# Patient Record
Sex: Male | Born: 1950
Health system: Southern US, Community
[De-identification: ages and names within clinical notes are randomized; demographics above are authoritative.]

## PROBLEM LIST (undated history)

## (undated) DIAGNOSIS — E785 Hyperlipidemia, unspecified: Secondary | ICD-10-CM

## (undated) DIAGNOSIS — T7840XA Allergy, unspecified, initial encounter: Secondary | ICD-10-CM

## (undated) DIAGNOSIS — I4892 Unspecified atrial flutter: Secondary | ICD-10-CM

## (undated) DIAGNOSIS — I480 Paroxysmal atrial fibrillation: Secondary | ICD-10-CM

## (undated) HISTORY — DX: Unspecified atrial flutter: I48.92

## (undated) HISTORY — PX: SKULL FRACTURE ELEVATION: SHX781

## (undated) HISTORY — PX: MOUTH SURGERY: SHX715

## (undated) HISTORY — DX: Hyperlipidemia, unspecified: E78.5

## (undated) HISTORY — DX: Paroxysmal atrial fibrillation: I48.0

## (undated) HISTORY — DX: Allergy, unspecified, initial encounter: T78.40XA

## (undated) HISTORY — PX: HAND SURGERY: SHX662

## (undated) HISTORY — PX: NASAL SINUS SURGERY: SHX719

---

## 2008-03-08 ENCOUNTER — Ambulatory Visit: Payer: Self-pay | Admitting: Gastroenterology

## 2008-03-13 ENCOUNTER — Telehealth: Payer: Self-pay | Admitting: Gastroenterology

## 2008-03-18 ENCOUNTER — Encounter: Payer: Self-pay | Admitting: Gastroenterology

## 2008-03-18 ENCOUNTER — Ambulatory Visit: Payer: Self-pay | Admitting: Gastroenterology

## 2008-03-19 ENCOUNTER — Encounter: Payer: Self-pay | Admitting: Gastroenterology

## 2016-02-11 DIAGNOSIS — L814 Other melanin hyperpigmentation: Secondary | ICD-10-CM | POA: Diagnosis not present

## 2016-02-11 DIAGNOSIS — D2272 Melanocytic nevi of left lower limb, including hip: Secondary | ICD-10-CM | POA: Diagnosis not present

## 2016-02-11 DIAGNOSIS — D485 Neoplasm of uncertain behavior of skin: Secondary | ICD-10-CM | POA: Diagnosis not present

## 2016-02-11 DIAGNOSIS — D2271 Melanocytic nevi of right lower limb, including hip: Secondary | ICD-10-CM | POA: Diagnosis not present

## 2016-02-11 DIAGNOSIS — L821 Other seborrheic keratosis: Secondary | ICD-10-CM | POA: Diagnosis not present

## 2016-02-11 DIAGNOSIS — L570 Actinic keratosis: Secondary | ICD-10-CM | POA: Diagnosis not present

## 2016-02-11 DIAGNOSIS — D225 Melanocytic nevi of trunk: Secondary | ICD-10-CM | POA: Diagnosis not present

## 2016-02-17 DIAGNOSIS — L570 Actinic keratosis: Secondary | ICD-10-CM | POA: Diagnosis not present

## 2016-04-19 DIAGNOSIS — E784 Other hyperlipidemia: Secondary | ICD-10-CM | POA: Diagnosis not present

## 2016-04-19 DIAGNOSIS — Z0000 Encounter for general adult medical examination without abnormal findings: Secondary | ICD-10-CM | POA: Diagnosis not present

## 2016-04-19 DIAGNOSIS — Z125 Encounter for screening for malignant neoplasm of prostate: Secondary | ICD-10-CM | POA: Diagnosis not present

## 2016-08-10 DIAGNOSIS — H6983 Other specified disorders of Eustachian tube, bilateral: Secondary | ICD-10-CM | POA: Diagnosis not present

## 2016-08-10 DIAGNOSIS — H6502 Acute serous otitis media, left ear: Secondary | ICD-10-CM | POA: Diagnosis not present

## 2016-08-10 DIAGNOSIS — J324 Chronic pansinusitis: Secondary | ICD-10-CM | POA: Diagnosis not present

## 2017-02-07 DIAGNOSIS — Z125 Encounter for screening for malignant neoplasm of prostate: Secondary | ICD-10-CM | POA: Diagnosis not present

## 2017-02-07 DIAGNOSIS — R82998 Other abnormal findings in urine: Secondary | ICD-10-CM | POA: Diagnosis not present

## 2017-02-07 DIAGNOSIS — E7849 Other hyperlipidemia: Secondary | ICD-10-CM | POA: Diagnosis not present

## 2017-02-14 DIAGNOSIS — Z1389 Encounter for screening for other disorder: Secondary | ICD-10-CM | POA: Diagnosis not present

## 2017-02-14 DIAGNOSIS — Z6829 Body mass index (BMI) 29.0-29.9, adult: Secondary | ICD-10-CM | POA: Diagnosis not present

## 2017-02-14 DIAGNOSIS — J302 Other seasonal allergic rhinitis: Secondary | ICD-10-CM | POA: Diagnosis not present

## 2017-02-14 DIAGNOSIS — R351 Nocturia: Secondary | ICD-10-CM | POA: Diagnosis not present

## 2017-02-14 DIAGNOSIS — Z23 Encounter for immunization: Secondary | ICD-10-CM | POA: Diagnosis not present

## 2017-02-14 DIAGNOSIS — E7849 Other hyperlipidemia: Secondary | ICD-10-CM | POA: Diagnosis not present

## 2017-02-14 DIAGNOSIS — Z0000 Encounter for general adult medical examination without abnormal findings: Secondary | ICD-10-CM | POA: Diagnosis not present

## 2017-02-14 DIAGNOSIS — F5221 Male erectile disorder: Secondary | ICD-10-CM | POA: Diagnosis not present

## 2017-02-16 ENCOUNTER — Other Ambulatory Visit: Payer: Self-pay | Admitting: Internal Medicine

## 2017-02-16 DIAGNOSIS — E785 Hyperlipidemia, unspecified: Secondary | ICD-10-CM

## 2017-02-18 DIAGNOSIS — Z1212 Encounter for screening for malignant neoplasm of rectum: Secondary | ICD-10-CM | POA: Diagnosis not present

## 2017-03-14 ENCOUNTER — Other Ambulatory Visit: Payer: Self-pay

## 2017-03-21 ENCOUNTER — Ambulatory Visit
Admission: RE | Admit: 2017-03-21 | Discharge: 2017-03-21 | Disposition: A | Discharge status: Home / Self Care | Payer: No Typology Code available for payment source | Source: Ambulatory Visit | Attending: Internal Medicine | Admitting: Internal Medicine

## 2017-03-21 DIAGNOSIS — E785 Hyperlipidemia, unspecified: Secondary | ICD-10-CM

## 2017-07-06 DIAGNOSIS — E7849 Other hyperlipidemia: Secondary | ICD-10-CM | POA: Diagnosis not present

## 2017-07-06 DIAGNOSIS — E785 Hyperlipidemia, unspecified: Secondary | ICD-10-CM | POA: Diagnosis not present

## 2017-07-14 DIAGNOSIS — M79661 Pain in right lower leg: Secondary | ICD-10-CM | POA: Diagnosis not present

## 2017-07-28 DIAGNOSIS — M79661 Pain in right lower leg: Secondary | ICD-10-CM | POA: Diagnosis not present

## 2017-11-22 DIAGNOSIS — Z125 Encounter for screening for malignant neoplasm of prostate: Secondary | ICD-10-CM | POA: Diagnosis not present

## 2017-11-22 DIAGNOSIS — N400 Enlarged prostate without lower urinary tract symptoms: Secondary | ICD-10-CM | POA: Diagnosis not present

## 2017-11-22 DIAGNOSIS — R361 Hematospermia: Secondary | ICD-10-CM | POA: Diagnosis not present

## 2018-02-10 DIAGNOSIS — E7849 Other hyperlipidemia: Secondary | ICD-10-CM | POA: Diagnosis not present

## 2018-02-10 DIAGNOSIS — R82998 Other abnormal findings in urine: Secondary | ICD-10-CM | POA: Diagnosis not present

## 2018-02-10 DIAGNOSIS — Z125 Encounter for screening for malignant neoplasm of prostate: Secondary | ICD-10-CM | POA: Diagnosis not present

## 2018-02-16 DIAGNOSIS — M62831 Muscle spasm of calf: Secondary | ICD-10-CM | POA: Diagnosis not present

## 2018-02-17 DIAGNOSIS — Z0000 Encounter for general adult medical examination without abnormal findings: Secondary | ICD-10-CM | POA: Diagnosis not present

## 2018-02-17 DIAGNOSIS — F5221 Male erectile disorder: Secondary | ICD-10-CM | POA: Diagnosis not present

## 2018-02-17 DIAGNOSIS — R361 Hematospermia: Secondary | ICD-10-CM | POA: Diagnosis not present

## 2018-02-17 DIAGNOSIS — M79669 Pain in unspecified lower leg: Secondary | ICD-10-CM | POA: Diagnosis not present

## 2018-02-17 DIAGNOSIS — Z1339 Encounter for screening examination for other mental health and behavioral disorders: Secondary | ICD-10-CM | POA: Diagnosis not present

## 2018-02-17 DIAGNOSIS — J302 Other seasonal allergic rhinitis: Secondary | ICD-10-CM | POA: Diagnosis not present

## 2018-02-17 DIAGNOSIS — R351 Nocturia: Secondary | ICD-10-CM | POA: Diagnosis not present

## 2018-02-17 DIAGNOSIS — Z1330 Encounter for screening examination for mental health and behavioral disorders, unspecified: Secondary | ICD-10-CM | POA: Diagnosis not present

## 2018-02-17 DIAGNOSIS — R7989 Other specified abnormal findings of blood chemistry: Secondary | ICD-10-CM | POA: Diagnosis not present

## 2018-02-17 DIAGNOSIS — E668 Other obesity: Secondary | ICD-10-CM | POA: Diagnosis not present

## 2018-02-17 DIAGNOSIS — E7849 Other hyperlipidemia: Secondary | ICD-10-CM | POA: Diagnosis not present

## 2018-02-17 DIAGNOSIS — Z1331 Encounter for screening for depression: Secondary | ICD-10-CM | POA: Diagnosis not present

## 2018-02-20 DIAGNOSIS — Z1212 Encounter for screening for malignant neoplasm of rectum: Secondary | ICD-10-CM | POA: Diagnosis not present

## 2018-03-16 ENCOUNTER — Encounter: Payer: Self-pay | Admitting: Gastroenterology

## 2018-04-03 ENCOUNTER — Telehealth: Payer: Self-pay | Admitting: *Deleted

## 2018-04-03 NOTE — Telephone Encounter (Signed)
Covid-19 travel screening questions  Have you traveled in the last 14 days? No If yes where?   Do you now or have you had a fever in the last 14 days? No  Do you have any respiratory symptoms of shortness of breath or cough now or in the last 14 days? No  Do you have a medical history of Congestive Heart Failure? No  Do you have a medical history of lung disease? No  Do you have any family members or close contacts with diagnosed or suspected Covid-19? No        

## 2018-04-03 NOTE — Telephone Encounter (Signed)
Covid-19 travel screening questions  Have you traveled in the last 14 days? If yes where?  Do you now or have you had a fever in the last 14 days?  Do you have any respiratory symptoms of shortness of breath or cough now or in the last 14 days?  Do you have a medical history of Congestive Heart Failure?  Do you have a medical history of lung disease?  Do you have any family members or close contacts with diagnosed or suspected Covid-19?       

## 2018-04-04 ENCOUNTER — Encounter: Payer: Self-pay | Admitting: Gastroenterology

## 2018-04-04 ENCOUNTER — Other Ambulatory Visit: Payer: Self-pay

## 2018-04-04 ENCOUNTER — Ambulatory Visit (AMBULATORY_SURGERY_CENTER): Payer: Self-pay

## 2018-04-04 VITALS — Ht 70.0 in | Wt 187.4 lb

## 2018-04-04 DIAGNOSIS — Z1211 Encounter for screening for malignant neoplasm of colon: Secondary | ICD-10-CM

## 2018-04-04 MED ORDER — PEG 3350-KCL-NA BICARB-NACL 420 G PO SOLR: 4000.0000 mL | Freq: Once | ORAL | 0 refills | Status: AC

## 2018-04-04 NOTE — Progress Notes (Signed)
Denies allergies to eggs or soy products. Denies complication of anesthesia or sedation. Denies use of weight loss medication. Denies use of O2.   Emmi instructions declined.   Patients temperature was 98.6. Patient states that he has not traveled internationally and he has not experienced any signs or symptoms of illness.   Patient was given a choice of bowel preps due to what his insurance will pay and he chose [facility].

## 2018-04-18 ENCOUNTER — Encounter: Payer: Self-pay | Admitting: Gastroenterology

## 2018-08-14 DIAGNOSIS — D224 Melanocytic nevi of scalp and neck: Secondary | ICD-10-CM | POA: Diagnosis not present

## 2018-08-14 DIAGNOSIS — D225 Melanocytic nevi of trunk: Secondary | ICD-10-CM | POA: Diagnosis not present

## 2018-08-14 DIAGNOSIS — D2262 Melanocytic nevi of left upper limb, including shoulder: Secondary | ICD-10-CM | POA: Diagnosis not present

## 2018-08-14 DIAGNOSIS — L814 Other melanin hyperpigmentation: Secondary | ICD-10-CM | POA: Diagnosis not present

## 2018-08-14 DIAGNOSIS — L570 Actinic keratosis: Secondary | ICD-10-CM | POA: Diagnosis not present

## 2018-08-14 DIAGNOSIS — D2261 Melanocytic nevi of right upper limb, including shoulder: Secondary | ICD-10-CM | POA: Diagnosis not present

## 2018-08-14 DIAGNOSIS — D1801 Hemangioma of skin and subcutaneous tissue: Secondary | ICD-10-CM | POA: Diagnosis not present

## 2018-08-14 DIAGNOSIS — L821 Other seborrheic keratosis: Secondary | ICD-10-CM | POA: Diagnosis not present

## 2019-02-09 ENCOUNTER — Ambulatory Visit: Payer: PPO | Attending: Internal Medicine

## 2019-02-09 DIAGNOSIS — Z23 Encounter for immunization: Secondary | ICD-10-CM | POA: Insufficient documentation

## 2019-02-09 NOTE — Progress Notes (Signed)
   Covid-19 Vaccination Clinic  Name:  Michael Hopkins    MRN: [id number] DOB: 06/03/1950  02/09/2019  [name] was observed post Covid-19 immunization for 15 minutes without incidence. He was provided with Vaccine Information Sheet and instruction to access the V-Safe system.   [name] was instructed to call 911 with any severe reactions post vaccine: Marland Kitchen Difficulty breathing  . Swelling of your face and throat  . A fast heartbeat  . A bad rash all over your body  . Dizziness and weakness    Immunizations Administered    Name Date Dose VIS Date Route   Pfizer COVID-19 Vaccine 02/09/2019  3:39 PM 0.3 mL 12/29/2018 Intramuscular   Manufacturer: [place]   Lot: [id number]   [place]: [id number]

## 2019-02-16 DIAGNOSIS — Z125 Encounter for screening for malignant neoplasm of prostate: Secondary | ICD-10-CM | POA: Diagnosis not present

## 2019-02-16 DIAGNOSIS — E7849 Other hyperlipidemia: Secondary | ICD-10-CM | POA: Diagnosis not present

## 2019-02-19 DIAGNOSIS — R82998 Other abnormal findings in urine: Secondary | ICD-10-CM | POA: Diagnosis not present

## 2019-02-23 DIAGNOSIS — E785 Hyperlipidemia, unspecified: Secondary | ICD-10-CM | POA: Diagnosis not present

## 2019-02-23 DIAGNOSIS — F5221 Male erectile disorder: Secondary | ICD-10-CM | POA: Diagnosis not present

## 2019-02-23 DIAGNOSIS — R351 Nocturia: Secondary | ICD-10-CM | POA: Diagnosis not present

## 2019-02-23 DIAGNOSIS — E669 Obesity, unspecified: Secondary | ICD-10-CM | POA: Diagnosis not present

## 2019-02-23 DIAGNOSIS — F17210 Nicotine dependence, cigarettes, uncomplicated: Secondary | ICD-10-CM | POA: Diagnosis not present

## 2019-02-23 DIAGNOSIS — R361 Hematospermia: Secondary | ICD-10-CM | POA: Diagnosis not present

## 2019-02-23 DIAGNOSIS — J302 Other seasonal allergic rhinitis: Secondary | ICD-10-CM | POA: Diagnosis not present

## 2019-02-23 DIAGNOSIS — Z1331 Encounter for screening for depression: Secondary | ICD-10-CM | POA: Diagnosis not present

## 2019-02-23 DIAGNOSIS — Z0000 Encounter for general adult medical examination without abnormal findings: Secondary | ICD-10-CM | POA: Diagnosis not present

## 2019-02-23 DIAGNOSIS — I700 Atherosclerosis of aorta: Secondary | ICD-10-CM | POA: Diagnosis not present

## 2019-02-23 DIAGNOSIS — M79669 Pain in unspecified lower leg: Secondary | ICD-10-CM | POA: Diagnosis not present

## 2019-02-23 DIAGNOSIS — R7989 Other specified abnormal findings of blood chemistry: Secondary | ICD-10-CM | POA: Diagnosis not present

## 2019-02-23 DIAGNOSIS — Z1339 Encounter for screening examination for other mental health and behavioral disorders: Secondary | ICD-10-CM | POA: Diagnosis not present

## 2019-02-23 DIAGNOSIS — I2584 Coronary atherosclerosis due to calcified coronary lesion: Secondary | ICD-10-CM | POA: Diagnosis not present

## 2019-02-26 DIAGNOSIS — Z1212 Encounter for screening for malignant neoplasm of rectum: Secondary | ICD-10-CM | POA: Diagnosis not present

## 2019-03-02 ENCOUNTER — Ambulatory Visit: Payer: PPO | Attending: Internal Medicine

## 2019-03-02 DIAGNOSIS — Z23 Encounter for immunization: Secondary | ICD-10-CM | POA: Insufficient documentation

## 2019-03-02 NOTE — Progress Notes (Signed)
   Covid-19 Vaccination Clinic  Name:  [name]    MRN: [id number] DOB: 1950-11-20  03/02/2019  Mr. Schoof was observed post Covid-19 immunization for 15 minutes without incidence. He was provided with Vaccine Information Sheet and instruction to access the V-Safe system.   Mr. Pound was instructed to call 911 with any severe reactions post vaccine: Marland Kitchen Difficulty breathing  . Swelling of your face and throat  . A fast heartbeat  . A bad rash all over your body  . Dizziness and weakness    Immunizations Administered    Name Date Dose VIS Date Route   Pfizer COVID-19 Vaccine 03/02/2019 11:42 AM 0.3 mL 12/29/2018 Intramuscular   Manufacturer: [place]   Lot: [id number]   [place]: [id number]

## 2019-08-07 DIAGNOSIS — L570 Actinic keratosis: Secondary | ICD-10-CM | POA: Diagnosis not present

## 2019-08-07 DIAGNOSIS — L814 Other melanin hyperpigmentation: Secondary | ICD-10-CM | POA: Diagnosis not present

## 2019-08-07 DIAGNOSIS — L821 Other seborrheic keratosis: Secondary | ICD-10-CM | POA: Diagnosis not present

## 2019-08-07 DIAGNOSIS — D225 Melanocytic nevi of trunk: Secondary | ICD-10-CM | POA: Diagnosis not present

## 2019-09-07 DIAGNOSIS — I2584 Coronary atherosclerosis due to calcified coronary lesion: Secondary | ICD-10-CM | POA: Diagnosis not present

## 2019-09-07 DIAGNOSIS — R002 Palpitations: Secondary | ICD-10-CM | POA: Diagnosis not present

## 2019-09-07 DIAGNOSIS — I700 Atherosclerosis of aorta: Secondary | ICD-10-CM | POA: Diagnosis not present

## 2019-09-07 DIAGNOSIS — E785 Hyperlipidemia, unspecified: Secondary | ICD-10-CM | POA: Diagnosis not present

## 2019-10-19 ENCOUNTER — Telehealth: Payer: Self-pay | Admitting: Cardiology

## 2019-10-19 ENCOUNTER — Other Ambulatory Visit: Payer: Self-pay | Admitting: *Deleted

## 2019-10-19 ENCOUNTER — Telehealth: Payer: Self-pay | Admitting: *Deleted

## 2019-10-19 DIAGNOSIS — R002 Palpitations: Secondary | ICD-10-CM

## 2019-10-19 NOTE — Telephone Encounter (Signed)
Confirmed with patient referral was not to see a cardiologist.  Information entered into Epic for referral was Event monitor for palpitation.14 day ZIO was not mentioned or specified what type of ZIO monitor.   Patient states he would like to proceed with [employer] cardiac event monitor and possibly wear it for just 14 days, but has the option of wearing it 30 days.  [employer] has already called to confirm a shipping address with patient and he has received approximately one half hour tutoring on monitor hookup and functions.

## 2019-10-19 NOTE — Telephone Encounter (Signed)
[facility] called and stated that the referral was strickly for zio monitor.

## 2019-10-19 NOTE — Telephone Encounter (Signed)
Patient enrolled for [employer] to ship a 30 day cardiac event monitor to his home.  Instructions reviewed with patient.

## 2019-10-22 ENCOUNTER — Encounter: Payer: Self-pay | Admitting: *Deleted

## 2019-10-22 NOTE — Progress Notes (Signed)
Patient ID: Michael Hopkins, male   DOB: Apr 02, 1950, 69 y.o.   MRN: [id number] Written order form  from [place] verified to read Event Monitor per Ronald Lobo. ZIO monitor not mentioned on order referral.

## 2019-10-29 ENCOUNTER — Ambulatory Visit (INDEPENDENT_AMBULATORY_CARE_PROVIDER_SITE_OTHER): Payer: PPO

## 2019-10-29 ENCOUNTER — Encounter: Payer: Self-pay | Admitting: Internal Medicine

## 2019-10-29 DIAGNOSIS — R002 Palpitations: Secondary | ICD-10-CM

## 2019-10-30 ENCOUNTER — Telehealth: Payer: Self-pay | Admitting: Cardiology

## 2019-10-30 ENCOUNTER — Encounter: Payer: Self-pay | Admitting: Cardiology

## 2019-10-30 ENCOUNTER — Other Ambulatory Visit: Payer: Self-pay

## 2019-10-30 ENCOUNTER — Ambulatory Visit: Payer: PPO | Admitting: Cardiology

## 2019-10-30 VITALS — BP 142/90 | HR 78 | Ht 70.0 in | Wt 191.8 lb

## 2019-10-30 DIAGNOSIS — I4891 Unspecified atrial fibrillation: Secondary | ICD-10-CM | POA: Diagnosis not present

## 2019-10-30 DIAGNOSIS — E7800 Pure hypercholesterolemia, unspecified: Secondary | ICD-10-CM | POA: Diagnosis not present

## 2019-10-30 DIAGNOSIS — R931 Abnormal findings on diagnostic imaging of heart and coronary circulation: Secondary | ICD-10-CM

## 2019-10-30 MED ORDER — METOPROLOL SUCCINATE ER 25 MG PO TB24: 25.0000 mg | ORAL_TABLET | Freq: Every day | ORAL | 1 refills | Status: DC

## 2019-10-30 MED ORDER — ATORVASTATIN CALCIUM 20 MG PO TABS: 20.0000 mg | ORAL_TABLET | Freq: Every day | ORAL | 1 refills | Status: DC

## 2019-10-30 MED ORDER — APIXABAN 5 MG PO TABS: 5.0000 mg | ORAL_TABLET | Freq: Two times a day (BID) | ORAL | 1 refills | Status: DC

## 2019-10-30 NOTE — Telephone Encounter (Signed)
Received a call from [employer] that pt had his first tracing of AF.  Pt does not have a history of AF.  Rate was 130, non sustained occurring this morning at 8:52AM CST.  Spoke with pt and he denies any symptoms, stating he was just waking up.  Has been having palps for a few months now and was concerned it might be AF due to family history (mom and brother have it).  Spoke with DOD, Dr. Johney Frame and she said ok to bring pt in today to discuss antiocoags.  Called pt and left message to call back.

## 2019-10-30 NOTE — Telephone Encounter (Signed)
Spoke with pt and he is agreeable to come in today for an appt.  Provided address.  Pt appreciative for call.

## 2019-10-30 NOTE — Telephone Encounter (Signed)
Patient returning phone call, transferred to [facility].

## 2019-10-30 NOTE — Progress Notes (Signed)
Cardiology Office Note:    Date:  10/30/2019   ID:  Michael Hopkins, DOB 1950-07-19, MRN [id number]  PCP:  Michael Baton, MD  [facility] Cardiologist:  No primary care provider on file.  [facility] Electrophysiologist:  None   Referring MD: Michael Baton, MD     History of Present Illness:    Michael Hopkins is a 69 y.o. male with a hx of HLD and palpitations who was noted to have new  Afib on ZIO monitor that was placed for palpitations now referred by Dr. Virgina Hopkins for further management of atrial fibrillation.  The patient states that he has been having palpitations, feelings of his "heart pounding" and rapid heart rates for several months. Saw his PCP who placed him on a zio patch and was found to have episode of Afib this AM. Asymptomatic at the time. Lasted a couple of seconds before returning to NSR. First day of the monitor was today with planned 2 weeks. Felt like he was in Afib yesterday while golfing as was having palpitations and HR was in 120s. No lightheadedness, dizziness, chest pain or SOB. The palpitations are "annoying" but do not limit his activities. He is physically active and plays tennis, pickle ball, and golf regularly.  Of note, coronary calcium score 3 years ago was 400. No CHF, DMII, HTN.     Mother: history of Afib and TIA; brother with Afib.  Past Medical History:  Diagnosis Date  . Allergy   . Hyperlipidemia     Past Surgical History:  Procedure Laterality Date  . HAND SURGERY Right    Broken bone in right hand.  Marland Kitchen MOUTH SURGERY     Age 13  . NASAL SINUS SURGERY    . SKULL FRACTURE ELEVATION     Skull Fracture at age 33     Current Medications: Current Meds  Medication Sig  . atorvastatin (LIPITOR) 20 MG tablet Take 1 tablet (20 mg total) by mouth daily.  . cetirizine (ZYRTEC) 10 MG chewable tablet Chew 10 mg by mouth daily.  . Multiple Vitamin (MULTIVITAMIN) tablet Take 1 tablet by mouth daily.  . [DISCONTINUED] aspirin EC 81 MG tablet Take  81 mg by mouth daily.  . [DISCONTINUED] atorvastatin (LIPITOR) 10 MG tablet Take 10 mg by mouth daily.     Allergies:   Patient has no known allergies.   Social History   Socioeconomic History  . Marital status: Divorced    Spouse name: Not on file  . Number of children: Not on file  . Years of education: Not on file  . Highest education level: Not on file  Occupational History  . Not on file  Tobacco Use  . Smoking status: Former Research scientist (life sciences)  . Smokeless tobacco: Never Used  . Tobacco comment: Quit 10 years ago  Substance and Sexual Activity  . Alcohol use: Not on file    Comment: Socially  . Drug use: Never  . Sexual activity: Not on file  Other Topics Concern  . Not on file  Social History Narrative  . Not on file   Social Determinants of Health   Financial Resource Strain:   . Difficulty of Paying Living Expenses: Not on file  Food Insecurity:   . Worried About Charity fundraiser in the Last Year: Not on file  . Ran Out of Food in the Last Year: Not on file  Transportation Needs:   . Lack of Transportation (Medical): Not on file  . Lack of Transportation (  Non-Medical): Not on file  Physical Activity:   . Days of Exercise per Week: Not on file  . Minutes of Exercise per Session: Not on file  Stress:   . Feeling of Stress : Not on file  Social Connections:   . Frequency of Communication with Friends and Family: Not on file  . Frequency of Social Gatherings with Friends and Family: Not on file  . Attends Religious Services: Not on file  . Active Member of Clubs or Organizations: Not on file  . Attends Archivist Meetings: Not on file  . Marital Status: Not on file     Family History: The patient's family history is negative for Colon cancer, Esophageal cancer, Rectal cancer, and Stomach cancer.  ROS:   Please see the history of present illness.    The patient denies chest pain, chest pressure, dyspnea at rest or with exertion, PND, orthopnea, or leg  swelling. Denies cough, fever, chills. Denies nausea, vomiting. Denies syncope or presyncope. Denies dizziness or lightheadedness. Denies snoring.  EKGs/Labs/Other Studies Reviewed:    The following studies were reviewed today: ZIO patch with brief episode of Afib  EKG:  EKG is  ordered today.  The ekg ordered today demonstrates NSR with HR 62  Recent Labs: No results found for requested labs within last 8760 hours.  Recent Lipid Panel No results found for: CHOL, TRIG, HDL, CHOLHDL, VLDL, LDLCALC, LDLDIRECT   CHA2DS2-VASc Score = 2 [CHF History: 0, HTN History: 0, Diabetes History: 0, Stroke History: 0, Vascular Disease History: 1, Age Score: 1, Gender Score: 0].  Therefore, the patient's annual risk of stroke is 2.2 %.     Physical Exam:    VS:  BP (!) 142/90   Pulse 78   Ht 5\' 10"  (1.778 m)   Wt 191 lb 12.8 oz (87 kg)   SpO2 97%   BMI 27.52 kg/m     Wt Readings from Last 3 Encounters:  10/30/19 191 lb 12.8 oz (87 kg)  04/04/18 187 lb 6.4 oz (85 kg)     GEN:  Well nourished, well developed in no acute distress HEENT: Normal NECK: No JVD; No carotid bruits LYMPHATICS: No lymphadenopathy CARDIAC: RRR, no murmurs, rubs, gallops RESPIRATORY:  Clear to auscultation without rales, wheezing or rhonchi  ABDOMEN: Soft, non-tender, non-distended MUSCULOSKELETAL:  No edema; No deformity  SKIN: Warm and dry NEUROLOGIC:  Alert and oriented x 3 PSYCHIATRIC:  Normal affect   ASSESSMENT:    1. Atrial fibrillation, unspecified type ([place])   2. [place]at[place] coronary artery calcium score greater than 400   3. Pure hypercholesterolemia    PLAN:    In order of problems listed above:  #Newly Diagnosed Atrial Fibrillation: Patient noted to have brief episode of Afib with HR 130 on ziopatch monitor that was placed by PCP for evaluation of palpitations. Asymptomatic during episode. CHADs-vasc 2 for age and coronary calcium score 400 indicative of coronary artery disease.  -Start apixaban  5mg  BID -Stop ASA -Start metoprolol 25mg  XL -Check TTE -TSH normal at 1.5 in 08/2019 -Continue ZIO patch as we want to see his burden of Afib  #Coronary calcium score 400: -Will increase atorvastatin to 40mg  daily; goal <70  -Stop ASA given that we are starting apixaban for Afib  #HLD: -Increase atorvastatin to 20mg  as above -Goal LDL<70 given coronary calcium score >400   Medication Adjustments/Labs and Tests Ordered: Current medicines are reviewed at length with the patient today.  Concerns regarding medicines are outlined above.  Orders Placed This Encounter  Procedures  . EKG 12-Lead  . ECHOCARDIOGRAM COMPLETE   Meds ordered this encounter  Medications  . apixaban (ELIQUIS) 5 MG TABS tablet    Sig: Take 1 tablet (5 mg total) by mouth 2 (two) times daily.    Dispense:  180 tablet    Refill:  1  . metoprolol succinate (TOPROL XL) 25 MG 24 hr tablet    Sig: Take 1 tablet (25 mg total) by mouth daily.    Dispense:  90 tablet    Refill:  1  . atorvastatin (LIPITOR) 20 MG tablet    Sig: Take 1 tablet (20 mg total) by mouth daily.    Dispense:  90 tablet    Refill:  1    Patient Instructions  Medication Instructions:   STOP TAKING :  1. ASPIRIN 81 MG   START TAKING:   1.METOPROLOL SUCCINATE ( TOPROL XL )  25 MG ONCE A DAY AT BEDTIME  2. ATORVASTATIN 20 MG ONCE A DAY   3.  APIXABAN (ELIQUIS)  5 MG TWICE A DAY     *If you need a refill on your cardiac medications before your next appointment, please call your pharmacy*   Lab Work: NONE ORDERED  TODAY   If you have labs (blood work) drawn today and your tests are completely normal, you will receive your results only by: Marland Kitchen MyChart Message (if you have MyChart) OR . A paper copy in the mail If you have any lab test that is abnormal or we need to change your treatment, we will call you to review the results.   Testing/Procedures: Your physician has requested that you have an echocardiogram. Echocardiography  is a painless test that uses sound waves to create images of your heart. It provides your doctor with information about the size and shape of your heart and how well your heart's chambers and valves are working. This procedure takes approximately one hour. There are no restrictions for this procedure.   Follow-Up: At [facility], you and your health needs are our priority.  As part of our continuing mission to provide you with exceptional heart care, we have created designated Provider Care Teams.  These Care Teams include your primary Cardiologist (physician) and Advanced Practice Providers (APPs -  Physician Assistants and Nurse Practitioners) who all work together to provide you with the care you need, when you need it.  We recommend signing up for the patient portal called "MyChart".  Sign up information is provided on this After Visit Summary.  MyChart is used to connect with patients for Virtual Visits (Telemedicine).  Patients are able to view lab/test results, encounter notes, upcoming appointments, etc.  Non-urgent messages can be sent to your provider as well.   To learn more about what you can do with MyChart, go to [email or web address].    Your next appointment:   6 month(s)  The format for your next appointment:   In Person  Provider:   Gwyndolyn Kaufman, MD   Other Instructions      Signed, Freada Bergeron, MD  10/30/2019 3:41 PM    [place]

## 2019-10-30 NOTE — Patient Instructions (Addendum)
Medication Instructions:   STOP TAKING :  1. ASPIRIN 81 MG   START TAKING:   1.METOPROLOL SUCCINATE ( TOPROL XL )  25 MG ONCE A DAY AT BEDTIME  2. ATORVASTATIN 20 MG ONCE A DAY   3.  APIXABAN (ELIQUIS)  5 MG TWICE A DAY     *If you need a refill on your cardiac medications before your next appointment, please call your pharmacy*   Lab Work: NONE ORDERED  TODAY   If you have labs (blood work) drawn today and your tests are completely normal, you will receive your results only by: Marland Kitchen MyChart Message (if you have MyChart) OR . A paper copy in the mail If you have any lab test that is abnormal or we need to change your treatment, we will call you to review the results.   Testing/Procedures: Your physician has requested that you have an echocardiogram. Echocardiography is a painless test that uses sound waves to create images of your heart. It provides your doctor with information about the size and shape of your heart and how well your heart's chambers and valves are working. This procedure takes approximately one hour. There are no restrictions for this procedure.   Follow-Up: At [facility], you and your health needs are our priority.  As part of our continuing mission to provide you with exceptional heart care, we have created designated Provider Care Teams.  These Care Teams include your primary Cardiologist (physician) and Advanced Practice Providers (APPs -  Physician Assistants and Nurse Practitioners) who all work together to provide you with the care you need, when you need it.  We recommend signing up for the patient portal called "MyChart".  Sign up information is provided on this After Visit Summary.  MyChart is used to connect with patients for Virtual Visits (Telemedicine).  Patients are able to view lab/test results, encounter notes, upcoming appointments, etc.  Non-urgent messages can be sent to your provider as well.   To learn more about what you can do with  MyChart, go to [email or web address].    Your next appointment:   6 month(s)  The format for your next appointment:   In Person  Provider:   Gwyndolyn Kaufman, MD   Other Instructions

## 2019-11-05 ENCOUNTER — Telehealth: Payer: Self-pay

## 2019-11-05 NOTE — Telephone Encounter (Signed)
Patient with another episode of Afib with RVR.Cannot increase his metop any further as patient bradycardiac at baseline. He is on [facility] with eliquis. Will continue same medications for now. He has palpitations but is not overly bothered by his Afib. We can continue to watch and if he wishes to pursue further intervention, can refer to EP in the future.  Gwyndolyn Kaufman, MD

## 2019-11-05 NOTE — Telephone Encounter (Signed)
Received [employer] monitor alert for 11/04/2019 at 315pm and 451pm.  Alerts show A-FIB with RVR with couplet PCP's and A-FIB RVR sustained with run of V-Tach (8beats) artifact respectively.    Spoke with pt who states he was aware he was in A-FIB which lasted for about an hour.  Pt reports taking Metoprolol with HR this am of 54.  Pt does not feel he is currently in A-FIB and reports no additional symptoms at this time.  Pt is anticoagulated with Eliquis.  Alerts addressed by DOD, Dr Rayann Heman who advises Dr Johney Frame review as pt was seen by Dr Johney Frame on 10/30/2019 who recommended continued monitoring at that time.  Monitor alert given to Janan Halter, RN to have Dr Johney Frame review.

## 2019-11-12 ENCOUNTER — Telehealth: Payer: Self-pay

## 2019-11-12 NOTE — Telephone Encounter (Signed)
Received Cardiac Report from [employer]: Afib RVR Sustained w/ couplet PVC's. Strip reviewed by Dr. Johney Frame who recently saw patient in office last week. Dr. Johney Frame recommended to continue monitoring to determine Afib burden as patient has been asymptomatic.

## 2019-11-20 ENCOUNTER — Ambulatory Visit (HOSPITAL_COMMUNITY): Payer: PPO | Attending: Cardiovascular Disease

## 2019-11-20 ENCOUNTER — Other Ambulatory Visit: Payer: Self-pay

## 2019-11-20 DIAGNOSIS — I4891 Unspecified atrial fibrillation: Secondary | ICD-10-CM | POA: Diagnosis not present

## 2019-11-20 LAB — ECHOCARDIOGRAM COMPLETE
Area-P 1/2: 4.06 cm2
S' Lateral: 3.3 cm

## 2019-11-21 NOTE — Telephone Encounter (Signed)
Per Dr. Johney Frame, EP referral placed for possible Afib Ablation.

## 2019-12-05 ENCOUNTER — Encounter: Payer: Self-pay | Admitting: *Deleted

## 2019-12-05 ENCOUNTER — Other Ambulatory Visit: Payer: Self-pay

## 2019-12-05 ENCOUNTER — Encounter: Payer: Self-pay | Admitting: Internal Medicine

## 2019-12-05 ENCOUNTER — Ambulatory Visit: Payer: PPO | Admitting: Internal Medicine

## 2019-12-05 VITALS — BP 100/70 | HR 66 | Ht 70.0 in | Wt 191.0 lb

## 2019-12-05 DIAGNOSIS — D6869 Other thrombophilia: Secondary | ICD-10-CM

## 2019-12-05 DIAGNOSIS — I4891 Unspecified atrial fibrillation: Secondary | ICD-10-CM

## 2019-12-05 DIAGNOSIS — I483 Typical atrial flutter: Secondary | ICD-10-CM

## 2019-12-05 DIAGNOSIS — I480 Paroxysmal atrial fibrillation: Secondary | ICD-10-CM

## 2019-12-05 NOTE — Progress Notes (Signed)
Electrophysiology Office Note   Date:  12/05/2019   ID:  Michael Hopkins, DOB 20-Jul-1950, MRN [id number]  PCP:  Shon Baton, MD  Cardiologist:  Dr Johney Frame Primary Electrophysiologist: Thompson Grayer, MD    CC: afib   History of Present Illness: Michael Hopkins is a 69 y.o. male who presents today for electrophysiology evaluation.   He is referred by Dr Johney Frame for EP consultation regarding afib.  The patient presented recently with concerns of palpitations.  Zio was placed which captured afib and atrial flutter (personally reviewed). The patient reports that for several months, he has had nearly daily tachypalpitations.  These last up to several hours.  + anxiety with them.  He has started eliquis and toprol.  Today, he denies symptoms of chest pain, shortness of breath, orthopnea, PND, lower extremity edema, claudication, dizziness, presyncope, syncope, bleeding, or neurologic sequela. The patient is tolerating medications without difficulties and is otherwise without complaint today.    Past Medical History:  Diagnosis Date  . Allergy   . Atrial flutter ([place])   . Hyperlipidemia   . Paroxysmal atrial fibrillation [facility])    Past Surgical History:  Procedure Laterality Date  . HAND SURGERY Right    Broken bone in right hand.  Marland Kitchen MOUTH SURGERY     Age 12  . NASAL SINUS SURGERY    . SKULL FRACTURE ELEVATION     Skull Fracture at age 16      Current Outpatient Medications  Medication Sig Dispense Refill  . apixaban (ELIQUIS) 5 MG TABS tablet Take 1 tablet (5 mg total) by mouth 2 (two) times daily. 180 tablet 1  . atorvastatin (LIPITOR) 20 MG tablet Take 1 tablet (20 mg total) by mouth daily. 90 tablet 1  . cetirizine (ZYRTEC) 10 MG chewable tablet Chew 10 mg by mouth daily.    . metoprolol succinate (TOPROL XL) 25 MG 24 hr tablet Take 1 tablet (25 mg total) by mouth daily. 90 tablet 1  . Multiple Vitamin (MULTIVITAMIN) tablet Take 1 tablet by mouth daily.     No current  facility-administered medications for this visit.    Allergies:   Patient has no known allergies.   Social History:  The patient  reports that he has quit smoking. He has never used smokeless tobacco. He reports that he does not use drugs.   Family History:  The patient's brother has afib and is s/p ablation  ROS:  Please see the history of present illness.   All other systems are personally reviewed and negative.    PHYSICAL EXAM: VS:  BP 100/70   Pulse 66   Ht 5\' 10"  (1.778 m)   Wt 191 lb (86.6 kg)   SpO2 97%   BMI 27.41 kg/m  , BMI Body mass index is 27.41 kg/m. GEN: Well nourished, well developed, in no acute distress HEENT: normal Neck: no JVD  Cardiac: RRR;  ,no edema  Respiratory:    normal work of breathing GI: soft, nontender, nondistended, + BS MS: no deformity or atrophy Skin: warm and dry  Neuro:  Strength and sensation are intact Psych: euthymic mood, full affect  EKG:  EKG is ordered today. The ekg ordered today is personally reviewed and shows sinus rhythm  Wt Readings from Last 3 Encounters:  12/05/19 191 lb (86.6 kg)  10/30/19 191 lb 12.8 oz (87 kg)  04/04/18 187 lb 6.4 oz (85 kg)    Echo 11/20/19- EF 60%, mild MR, normal atrial size  Other studies  personally reviewed: Additional studies/ records that were reviewed today include: Dr Jacolyn Reedy notes, recent event monitor, echo  Review of the above records today demonstrates: as above   ASSESSMENT AND PLAN:  1.  Paroxysmal atrial fibrillation/ atrial flutter ([place]) The patient has symptomatic, recurrent paroxysmal atrial fibrillation and atrial flutter he has failed medical therapy with toprol. Chads2vasc score is 1.  he is anticoagulated with eliquis . Therapeutic strategies for afib including medicine and ablation were discussed in detail with the patient today. Risk, benefits, and alternatives to EP study and radiofrequency ablation for afib were also discussed in detail today. These risks  include but are not limited to stroke, bleeding, vascular damage, tamponade, perforation, damage to the esophagus, lungs, and other structures, pulmonary vein stenosis, worsening renal function, and death. The patient understands these risk and wishes to proceed.  We will therefore proceed with catheter ablation at the next available time.  Carto, ICE, anesthesia are requested for the procedure.  Will also obtain cardiac CT prior to the procedure to exclude LAA thrombus and further evaluate atrial anatomy.   Risks, benefits and potential toxicities for medications prescribed and/or refilled reviewed with patient today.    Current medicines are reviewed at length with the patient today.   The patient does not have concerns regarding his medicines.  The following changes were made today:  none    Signed, Thompson Grayer, MD  12/05/2019 1:16 PM     [address] [place] [id number] [phone] (office) [phone] (fax)

## 2019-12-05 NOTE — Patient Instructions (Signed)
Medication Instructions:  Your physician recommends that you continue on your current medications as directed. Please refer to the Current Medication list given to you today.  *If you need a refill on your cardiac medications before your next appointment, please call your pharmacy*  Lab Work: None ordered.  If you have labs (blood work) drawn today and your tests are completely normal, you will receive your results only by: Marland Kitchen MyChart Message (if you have MyChart) OR . A paper copy in the mail If you have any lab test that is abnormal or we need to change your treatment, we will call you to review the results.  Testing/Procedures: Your physician has recommended that you have an ablation. Catheter ablation is a medical procedure used to treat some cardiac arrhythmias (irregular heartbeats). During catheter ablation, a long, thin, flexible tube is put into a blood vessel in your groin (upper thigh), or neck. This tube is called an ablation catheter. It is then guided to your heart through the blood vessel. Radio frequency waves destroy small areas of heart tissue where abnormal heartbeats may cause an arrhythmia to start. Please see the instruction sheet given to you today.   Follow-Up: At [facility], you and your health needs are our priority.  As part of our continuing mission to provide you with exceptional heart care, we have created designated Provider Care Teams.  These Care Teams include your primary Cardiologist (physician) and Advanced Practice Providers (APPs -  Physician Assistants and Nurse Practitioners) who all work together to provide you with the care you need, when you need it.  We recommend signing up for the patient portal called "MyChart".  Sign up information is provided on this After Visit Summary.  MyChart is used to connect with patients for Virtual Visits (Telemedicine).  Patients are able to view lab/test results, encounter notes, upcoming appointments, etc.  Non-urgent  messages can be sent to your provider as well.   To learn more about what you can do with MyChart, go to [email or web address].    Your next appointment:   Your physician wants you to follow-up in: 1 year with Dr. Rayann Heman.You will receive a reminder letter in the mail two months in advance. If you don't receive a letter, please call our office to schedule the follow-up appointment.    Other Instructions: . Cardiac Ablation Cardiac ablation is a procedure to disable (ablate) a small amount of heart tissue in very specific places. The heart has many electrical connections. Sometimes these connections are abnormal and can cause the heart to beat very fast or irregularly. Ablating some of the problem areas can improve the heart rhythm or return it to normal. Ablation may be done for people who:  Have Wolff-Parkinson-White syndrome.  Have fast heart rhythms (tachycardia).  Have taken medicines for an abnormal heart rhythm (arrhythmia) that were not effective or caused side effects.  Have a high-risk heartbeat that may be life-threatening. During the procedure, a small incision is made in the neck or the groin, and a long, thin, flexible tube (catheter) is inserted into the incision and moved to the heart. Small devices (electrodes) on the tip of the catheter will send out electrical currents. A type of X-ray (fluoroscopy) will be used to help guide the catheter and to provide images of the heart. Tell a health care provider about:  Any allergies you have.  All medicines you are taking, including vitamins, herbs, eye drops, creams, and over-the-counter medicines.  Any problems you or family members  have had with anesthetic medicines.  Any blood disorders you have.  Any surgeries you have had.  Any medical conditions you have, such as kidney failure.  Whether you are pregnant or may be pregnant. What are the risks? Generally, this is a safe procedure. However, problems may occur,  including:  Infection.  Bruising and bleeding at the catheter insertion site.  Bleeding into the chest, especially into the sac that surrounds the heart. This is a serious complication.  Stroke or blood clots.  Damage to other structures or organs.  Allergic reaction to medicines or dyes.  Need for a permanent pacemaker if the normal electrical system is damaged. A pacemaker is a small computer that sends electrical signals to the heart and helps your heart beat normally.  The procedure not being fully effective. This may not be recognized until months later. Repeat ablation procedures are sometimes required. What happens before the procedure?  Follow instructions from your health care provider about eating or drinking restrictions.  Ask your health care provider about: ? Changing or stopping your regular medicines. This is especially important if you are taking diabetes medicines or blood thinners. ? Taking medicines such as aspirin and ibuprofen. These medicines can thin your blood. Do not take these medicines before your procedure if your health care provider instructs you not to.  Plan to have someone take you home from the hospital or clinic.  If you will be going home right after the procedure, plan to have someone with you for 24 hours. What happens during the procedure?  To lower your risk of infection: ? Your health care team will wash or sanitize their hands. ? Your skin will be washed with soap. ? Hair may be removed from the incision area.  An IV tube will be inserted into one of your veins.  You will be given a medicine to help you relax (sedative).  The skin on your neck or groin will be numbed.  An incision will be made in your neck or your groin.  A needle will be inserted through the incision and into a large vein in your neck or groin.  A catheter will be inserted into the needle and moved to your heart.  Dye may be injected through the catheter to  help your surgeon see the area of the heart that needs treatment.  Electrical currents will be sent from the catheter to ablate heart tissue in desired areas. There are three types of energy that may be used to ablate heart tissue: ? Heat (radiofrequency energy). ? Laser energy. ? Extreme cold (cryoablation).  When the necessary tissue has been ablated, the catheter will be removed.  Pressure will be held on the catheter insertion area to prevent excessive bleeding.  A bandage (dressing) will be placed over the catheter insertion area. The procedure may vary among health care providers and hospitals. What happens after the procedure?  Your blood pressure, heart rate, breathing rate, and blood oxygen level will be monitored until the medicines you were given have worn off.  Your catheter insertion area will be monitored for bleeding. You will need to lie still for a few hours to ensure that you do not bleed from the catheter insertion area.  Do not drive for 24 hours or as long as directed by your health care provider. Summary  Cardiac ablation is a procedure to disable (ablate) a small amount of heart tissue in very specific places. Ablating some of the problem areas can improve  the heart rhythm or return it to normal.  During the procedure, electrical currents will be sent from the catheter to ablate heart tissue in desired areas. This information is not intended to replace advice given to you by your health care provider. Make sure you discuss any questions you have with your health care provider. Document Revised: 06/27/2017 Document Reviewed: 11/24/2015 Elsevier Patient Education  [place].

## 2019-12-19 ENCOUNTER — Other Ambulatory Visit: Payer: PPO

## 2019-12-19 ENCOUNTER — Other Ambulatory Visit: Payer: Self-pay

## 2019-12-19 DIAGNOSIS — I483 Typical atrial flutter: Secondary | ICD-10-CM

## 2019-12-19 DIAGNOSIS — D6869 Other thrombophilia: Secondary | ICD-10-CM

## 2019-12-19 DIAGNOSIS — I4891 Unspecified atrial fibrillation: Secondary | ICD-10-CM | POA: Diagnosis not present

## 2019-12-19 DIAGNOSIS — I480 Paroxysmal atrial fibrillation: Secondary | ICD-10-CM | POA: Diagnosis not present

## 2019-12-19 LAB — BASIC METABOLIC PANEL
BUN/Creatinine Ratio: 18 (ref 10–24)
BUN: 15 mg/dL (ref 8–27)
CO2: 26 mmol/L (ref 20–29)
Calcium: 9.1 mg/dL (ref 8.6–10.2)
Chloride: 105 mmol/L (ref 96–106)
Creatinine, Ser: 0.82 mg/dL (ref 0.76–1.27)
GFR calc Af Amer: 104 mL/min/{1.73_m2} (ref >59)
GFR calc non Af Amer: 90 mL/min/{1.73_m2} (ref >59)
Glucose: 91 mg/dL (ref 65–99)
Potassium: 4.7 mmol/L (ref 3.5–5.2)
Sodium: 140 mmol/L (ref 134–144)

## 2019-12-19 LAB — CBC WITH DIFFERENTIAL/PLATELET
Basophils Absolute: 0 10*3/uL (ref 0.0–0.2)
Basos: 1 %
EOS (ABSOLUTE): 0.2 10*3/uL (ref 0.0–0.4)
Eos: 4 %
Hematocrit: 40 % (ref 37.5–51.0)
Hemoglobin: 13.7 g/dL (ref 13.0–17.7)
Immature Grans (Abs): 0 10*3/uL (ref 0.0–0.1)
Immature Granulocytes: 0 %
Lymphocytes Absolute: 1.6 10*3/uL (ref 0.7–3.1)
Lymphs: 27 %
MCH: 32 pg (ref 26.6–33.0)
MCHC: 34.3 g/dL (ref 31.5–35.7)
MCV: 94 fL (ref 79–97)
Monocytes Absolute: 0.6 10*3/uL (ref 0.1–0.9)
Monocytes: 10 %
Neutrophils Absolute: 3.6 10*3/uL (ref 1.4–7.0)
Neutrophils: 58 %
Platelets: 211 10*3/uL (ref 150–450)
RBC: 4.28 x10E6/uL (ref 4.14–5.80)
RDW: 12.6 % (ref 11.6–15.4)
WBC: 6.1 10*3/uL (ref 3.4–10.8)

## 2020-01-01 ENCOUNTER — Telehealth (HOSPITAL_COMMUNITY): Payer: Self-pay | Admitting: Emergency Medicine

## 2020-01-01 NOTE — Telephone Encounter (Signed)
Reaching out to patient to offer assistance regarding upcoming cardiac imaging study; pt verbalizes understanding of appt date/time, parking situation and where to check in, pre-test NPO status and medications ordered, and verified current allergies; name and call back number provided for further questions should they arise Michael Bond RN Navigator Cardiac Imaging [facility] [phone] office [phone] cell  Pt to take 25mg  metop 2 hr prior to scan [facility]

## 2020-01-01 NOTE — Telephone Encounter (Signed)
Attempted to call patient regarding upcoming cardiac CT appointment. °Left message on voicemail with name and callback number °Arieon Corcoran RN Navigator Cardiac Imaging °[facility] °[phone] Office °[phone] Cell ° °

## 2020-01-03 ENCOUNTER — Other Ambulatory Visit: Payer: Self-pay

## 2020-01-03 ENCOUNTER — Ambulatory Visit (HOSPITAL_COMMUNITY)
Admission: RE | Admit: 2020-01-03 | Discharge: 2020-01-03 | Disposition: A | Discharge status: Home / Self Care | Payer: PPO | Source: Ambulatory Visit | Attending: Internal Medicine | Admitting: Internal Medicine

## 2020-01-03 DIAGNOSIS — I4891 Unspecified atrial fibrillation: Secondary | ICD-10-CM

## 2020-01-03 MED ORDER — IOHEXOL 350 MG/ML SOLN
80.0000 mL | Freq: Once | INTRAVENOUS | Status: AC | PRN
  Administered 2020-01-03: 80 mL via INTRAVENOUS

## 2020-01-05 ENCOUNTER — Other Ambulatory Visit (HOSPITAL_COMMUNITY)
Admission: RE | Admit: 2020-01-05 | Discharge: 2020-01-05 | Disposition: A | Discharge status: Home / Self Care | Payer: PPO | Source: Ambulatory Visit | Attending: Internal Medicine | Admitting: Internal Medicine

## 2020-01-05 DIAGNOSIS — Z20822 Contact with and (suspected) exposure to covid-19: Secondary | ICD-10-CM | POA: Diagnosis not present

## 2020-01-05 DIAGNOSIS — Z01812 Encounter for preprocedural laboratory examination: Secondary | ICD-10-CM | POA: Diagnosis not present

## 2020-01-05 LAB — SARS CORONAVIRUS 2 (TAT 6-24 HRS): SARS Coronavirus 2: NEGATIVE

## 2020-01-07 NOTE — Progress Notes (Signed)
Instructed patient on the following items: Arrival time 0530 Nothing to eat or drink after midnight No meds AM of procedure Responsible person to drive you home and stay with you for 24 hrs  Have you missed any doses of anti-coagulant Eliquis- none doses missed

## 2020-01-08 ENCOUNTER — Ambulatory Visit (HOSPITAL_COMMUNITY)
Admission: RE | Admit: 2020-01-08 | Discharge: 2020-01-08 | Disposition: A | Discharge status: Home / Self Care | Payer: PPO | Attending: Internal Medicine | Admitting: Internal Medicine

## 2020-01-08 ENCOUNTER — Encounter (HOSPITAL_COMMUNITY)
Admission: RE | Disposition: A | Discharge status: Home / Self Care | Payer: Self-pay | Source: Home / Self Care | Attending: Internal Medicine

## 2020-01-08 ENCOUNTER — Ambulatory Visit (HOSPITAL_COMMUNITY): Payer: PPO | Admitting: Anesthesiology

## 2020-01-08 ENCOUNTER — Other Ambulatory Visit: Payer: Self-pay

## 2020-01-08 DIAGNOSIS — Z79899 Other long term (current) drug therapy: Secondary | ICD-10-CM | POA: Diagnosis not present

## 2020-01-08 DIAGNOSIS — I483 Typical atrial flutter: Secondary | ICD-10-CM | POA: Diagnosis not present

## 2020-01-08 DIAGNOSIS — I4892 Unspecified atrial flutter: Secondary | ICD-10-CM | POA: Diagnosis not present

## 2020-01-08 DIAGNOSIS — Z7901 Long term (current) use of anticoagulants: Secondary | ICD-10-CM | POA: Diagnosis not present

## 2020-01-08 DIAGNOSIS — Z8249 Family history of ischemic heart disease and other diseases of the circulatory system: Secondary | ICD-10-CM | POA: Insufficient documentation

## 2020-01-08 DIAGNOSIS — Z87891 Personal history of nicotine dependence: Secondary | ICD-10-CM | POA: Insufficient documentation

## 2020-01-08 DIAGNOSIS — I480 Paroxysmal atrial fibrillation: Secondary | ICD-10-CM | POA: Diagnosis not present

## 2020-01-08 DIAGNOSIS — E785 Hyperlipidemia, unspecified: Secondary | ICD-10-CM | POA: Diagnosis not present

## 2020-01-08 HISTORY — PX: ATRIAL FIBRILLATION ABLATION: EP1191

## 2020-01-08 SURGERY — ATRIAL FIBRILLATION ABLATION
Anesthesia: General

## 2020-01-08 MED ORDER — HEPARIN (PORCINE) IN NACL 1000-0.9 UT/500ML-% IV SOLN
INTRAVENOUS | Status: AC
  Filled 2020-01-08: qty 500

## 2020-01-08 MED ORDER — HYDROCODONE-ACETAMINOPHEN 5-325 MG PO TABS: 1.0000 | ORAL_TABLET | ORAL | Status: DC | PRN

## 2020-01-08 MED ORDER — LIDOCAINE 2% (20 MG/ML) 5 ML SYRINGE
INTRAMUSCULAR | Status: DC | PRN
  Administered 2020-01-08: 60 mg via INTRAVENOUS

## 2020-01-08 MED ORDER — PHENYLEPHRINE HCL-NACL 10-0.9 MG/250ML-% IV SOLN
INTRAVENOUS | Status: DC | PRN
  Administered 2020-01-08: 50 ug/min via INTRAVENOUS

## 2020-01-08 MED ORDER — SUGAMMADEX SODIUM 200 MG/2ML IV SOLN
INTRAVENOUS | Status: DC | PRN
  Administered 2020-01-08: 200 mg via INTRAVENOUS

## 2020-01-08 MED ORDER — SODIUM CHLORIDE 0.9 % IV SOLN: 250.0000 mL | INTRAVENOUS | Status: DC | PRN

## 2020-01-08 MED ORDER — DEXAMETHASONE SODIUM PHOSPHATE 10 MG/ML IJ SOLN
INTRAMUSCULAR | Status: DC | PRN
  Administered 2020-01-08: 10 mg via INTRAVENOUS

## 2020-01-08 MED ORDER — PANTOPRAZOLE SODIUM 40 MG PO TBEC: 40.0000 mg | DELAYED_RELEASE_TABLET | Freq: Every day | ORAL | 0 refills | Status: DC

## 2020-01-08 MED ORDER — ISOPROTERENOL HCL 0.2 MG/ML IJ SOLN
INTRAVENOUS | Status: DC | PRN
  Administered 2020-01-08: 20 ug/min via INTRAVENOUS

## 2020-01-08 MED ORDER — SODIUM CHLORIDE 0.9% FLUSH: 3.0000 mL | INTRAVENOUS | Status: DC | PRN

## 2020-01-08 MED ORDER — ROCURONIUM BROMIDE 10 MG/ML (PF) SYRINGE
PREFILLED_SYRINGE | INTRAVENOUS | Status: DC | PRN
  Administered 2020-01-08: 20 mg via INTRAVENOUS
  Administered 2020-01-08: 60 mg via INTRAVENOUS

## 2020-01-08 MED ORDER — ACETAMINOPHEN 325 MG PO TABS: 650.0000 mg | ORAL_TABLET | ORAL | Status: DC | PRN

## 2020-01-08 MED ORDER — SODIUM CHLORIDE 0.9 % IV SOLN: INTRAVENOUS | Status: DC

## 2020-01-08 MED ORDER — HEPARIN (PORCINE) IN NACL 1000-0.9 UT/500ML-% IV SOLN
INTRAVENOUS | Status: DC | PRN
  Administered 2020-01-08: 500 mL

## 2020-01-08 MED ORDER — PROPOFOL 10 MG/ML IV BOLUS
INTRAVENOUS | Status: DC | PRN
  Administered 2020-01-08: 150 mg via INTRAVENOUS

## 2020-01-08 MED ORDER — MIDAZOLAM HCL 2 MG/2ML IJ SOLN
INTRAMUSCULAR | Status: DC | PRN
  Administered 2020-01-08: 2 mg via INTRAVENOUS

## 2020-01-08 MED ORDER — HEPARIN SODIUM (PORCINE) 1000 UNIT/ML IJ SOLN
INTRAMUSCULAR | Status: DC | PRN
  Administered 2020-01-08: 2000 [IU] via INTRAVENOUS

## 2020-01-08 MED ORDER — ONDANSETRON HCL 4 MG/2ML IJ SOLN: 4.0000 mg | Freq: Four times a day (QID) | INTRAMUSCULAR | Status: DC | PRN

## 2020-01-08 MED ORDER — HEPARIN SODIUM (PORCINE) 1000 UNIT/ML IJ SOLN
INTRAMUSCULAR | Status: AC
  Filled 2020-01-08: qty 2

## 2020-01-08 MED ORDER — ONDANSETRON HCL 4 MG/2ML IJ SOLN
INTRAMUSCULAR | Status: DC | PRN
  Administered 2020-01-08: 4 mg via INTRAVENOUS

## 2020-01-08 MED ORDER — FENTANYL CITRATE (PF) 100 MCG/2ML IJ SOLN
INTRAMUSCULAR | Status: DC | PRN
Start: 2020-01-08 — End: 2020-01-08
  Administered 2020-01-08: 100 ug via INTRAVENOUS

## 2020-01-08 MED ORDER — APIXABAN 5 MG PO TABS
5.0000 mg | ORAL_TABLET | Freq: Once | ORAL | Status: AC
  Administered 2020-01-08: 5 mg via ORAL
  Filled 2020-01-08: qty 1

## 2020-01-08 MED ORDER — PROTAMINE SULFATE 10 MG/ML IV SOLN
INTRAVENOUS | Status: DC | PRN
  Administered 2020-01-08: 40 mg via INTRAVENOUS

## 2020-01-08 MED ORDER — EPHEDRINE SULFATE-NACL 50-0.9 MG/10ML-% IV SOSY
PREFILLED_SYRINGE | INTRAVENOUS | Status: DC | PRN
  Administered 2020-01-08 (×2): 10 mg via INTRAVENOUS

## 2020-01-08 MED ORDER — HEPARIN SODIUM (PORCINE) 1000 UNIT/ML IJ SOLN
INTRAMUSCULAR | Status: DC | PRN
  Administered 2020-01-08: 14000 [IU] via INTRAVENOUS
  Administered 2020-01-08: 1000 [IU] via INTRAVENOUS

## 2020-01-08 MED ORDER — ISOPROTERENOL HCL 0.2 MG/ML IJ SOLN
INTRAMUSCULAR | Status: AC
  Filled 2020-01-08: qty 5

## 2020-01-08 MED ORDER — SODIUM CHLORIDE 0.9% FLUSH: 3.0000 mL | Freq: Two times a day (BID) | INTRAVENOUS | Status: DC

## 2020-01-08 SURGICAL SUPPLY — 18 items
BLANKET WARM UNDERBOD FULL ACC (MISCELLANEOUS) ×2 IMPLANT
CATH 8FR REPROCESSED SOUNDSTAR (CATHETERS) ×2 IMPLANT
CATH MAPPNG PENTARAY F 2-6-2MM (CATHETERS) ×1 IMPLANT
CATH SMTCH THERMOCOOL SF DF (CATHETERS) ×2 IMPLANT
CATH WEB BI DIR CSDF CRV REPRO (CATHETERS) ×2 IMPLANT
CLOSURE PERCLOSE PROSTYLE (VASCULAR PRODUCTS) ×6 IMPLANT
COVER SWIFTLINK CONNECTOR (BAG) ×2 IMPLANT
NEEDLE BAYLIS TRANSSEPTAL 71CM (NEEDLE) ×2 IMPLANT
PACK EP LATEX FREE (CUSTOM PROCEDURE TRAY) ×2
PACK EP LF (CUSTOM PROCEDURE TRAY) ×1 IMPLANT
PAD PRO RADIOLUCENT 2001M-C (PAD) ×2 IMPLANT
PATCH CARTO3 (PAD) ×2 IMPLANT
PENTARAY F 2-6-2MM (CATHETERS) ×2
SHEATH PINNACLE 7F 10CM (SHEATH) ×4 IMPLANT
SHEATH PINNACLE 9F 10CM (SHEATH) ×2 IMPLANT
SHEATH PROBE COVER 6X72 (BAG) ×2 IMPLANT
SHEATH SWARTZ TS SL2 63CM 8.5F (SHEATH) ×2 IMPLANT
TUBING SMART ABLATE COOLFLOW (TUBING) ×2 IMPLANT

## 2020-01-08 NOTE — Progress Notes (Signed)
Dr Rayann Heman in room assessing patient, and order to discharge patient now. Right groin dressing site dry and intact.

## 2020-01-08 NOTE — Anesthesia Procedure Notes (Signed)
Procedure Name: Intubation Date/Time: 01/08/2020 7:37 AM Performed by: Valda Favia, CRNA Pre-anesthesia Checklist: Patient identified, Emergency Drugs available, Suction available and Patient being monitored Patient Re-evaluated:Patient Re-evaluated prior to induction Oxygen Delivery Method: Circle System Utilized Preoxygenation: Pre-oxygenation with 100% oxygen Induction Type: IV induction Ventilation: Mask ventilation without difficulty and Oral airway inserted - appropriate to patient size Laryngoscope Size: Mac and 4 Grade View: Grade II Tube type: Oral Tube size: 7.5 mm Number of attempts: 1 Airway Equipment and Method: Stylet and Oral airway Placement Confirmation: ETT inserted through vocal cords under direct vision,  positive ETCO2 and breath sounds checked- equal and bilateral Secured at: 22 cm Tube secured with: Tape Dental Injury: Teeth and Oropharynx as per pre-operative assessment

## 2020-01-08 NOTE — Discharge Instructions (Signed)
Post procedure care instructions No driving for 4 days. No lifting over 5 lbs for 1 week. No vigorous or sexual activity for 1 week. You may return to work/your usual activities on 01/15/2020. Keep procedure site clean & dry. If you notice increased pain, swelling, bleeding or pus, call/return!  You may shower after 24 hours, but no soaking in baths/hot tubs/pools for 1 week.     Femoral Site Care Drink plenty of fluids for 24 hours  This sheet gives you information about how to care for yourself after your procedure. Your health care provider may also give you more specific instructions. If you have problems or questions, contact your health care provider. What can I expect after the procedure? After the procedure, it is common to have:  Bruising that usually fades within 1-2 weeks.  Tenderness at the site. Follow these instructions at home: Wound care  Follow instructions from your health care provider about how to take care of your insertion site. Make sure you: ? Wash your hands with soap and water before you change your bandage (dressing). If soap and water are not available, use hand sanitizer. ? Change your dressing as told by your health care provider.    Do not take baths, swim, or use a hot tub until your health care provider approves.  You may shower 24-48 hours after the procedure or as told by your health care provider. ? Gently wash the site with plain soap and water. ? Pat the area dry with a clean towel. ? Do not rub the site. This may cause bleeding.  Do not apply powder or lotion to the site. Keep the site clean and dry.  Check your femoral site every day for signs of infection. Check for: ? Redness, swelling, or pain. ? Fluid or blood. ? Warmth. ? Pus or a bad smell. Activity  For the first 2-3 days after your procedure, or as long as directed: ? Avoid climbing stairs as much as possible. ? Do not squat.  Do not lift anything that is heavier than 10 lb  (4.5 kg), or the limit that you are told, until your health care provider says that it is safe.  Rest as directed. ? Avoid sitting for a long time without moving. Get up to take short walks every 1-2 hours.  Do not drive for 24 hours if you were given a medicine to help you relax (sedative). General instructions  Take over-the-counter and prescription medicines only as told by your health care provider.  Keep all follow-up visits as told by your health care provider. This is important. Contact a health care provider if you have:  A fever or chills.  You have redness, swelling, or pain around your insertion site. Get help right away if:  The catheter insertion area swells very fast.  You pass out.  You suddenly start to sweat or your skin gets clammy.  The catheter insertion area is bleeding, and the bleeding does not stop when you hold steady pressure on the area.  The area near or just beyond the catheter insertion site becomes pale, cool, tingly, or numb. These symptoms may represent a serious problem that is an emergency. Do not wait to see if the symptoms will go away. Get medical help right away. Call your local emergency services (911 in the [place] Do not drive yourself to the hospital. Summary  After the procedure, it is common to have bruising that usually fades within 1-2 weeks.  Check your  femoral site every day for signs of infection.  Do not lift anything that is heavier than 10 lb (4.5 kg), or the limit that you are told, until your health care provider says that it is safe. This information is not intended to replace advice given to you by your health care provider. Make sure you discuss any questions you have with your health care provider. Document Revised: 01/17/2017 Document Reviewed: 01/17/2017 Elsevier Patient Education  2020 [place] have an appointment set up with the [facility].  Multiple studies have shown that being  followed by a dedicated atrial fibrillation clinic in addition to the standard care you receive from your other physicians improves health. We believe that enrollment in the atrial fibrillation clinic will allow [place] to better care for you.   The phone number to the [facility] is [phone]. The clinic is staffed Monday through Friday from 8:30am to 5pm.  Parking Directions: The clinic is located in the Heart and Vascular Building connected to [facility]. 1)From [address] turn on to [address] and go to the 3rd entrance  (Heart and Vascular entrance) on the right. 2)Look to the right for Heart &Vascular Parking Garage. 3)A code for the entrance is required, for January is 1111.   4)Take the elevators to the 1st floor. Registration is in the room with the glass walls at the end of the hallway.  If you have any trouble parking or locating the clinic, please don't hesitate to call [phone].

## 2020-01-08 NOTE — Anesthesia Preprocedure Evaluation (Addendum)
Anesthesia Evaluation  Patient identified by MRN, date of birth, ID band Patient awake    Reviewed: Allergy & Precautions, NPO status , Patient's Chart, lab work & pertinent test results  Airway Mallampati: II  TM Distance: >3 FB Neck ROM: Full    Dental no notable dental hx.    Pulmonary former smoker,    Pulmonary exam normal breath sounds clear to auscultation       Cardiovascular + dysrhythmias Atrial Fibrillation  Rhythm:Irregular Rate:Normal  ECG: rate 68  ECHO: 1. Left ventricular ejection fraction, by estimation, is 60 to 65%. The left ventricle has normal function. The left ventricle has no regional wall motion abnormalities. Left ventricular diastolic parameters were normal. The average left ventricular global longitudinal strain is - 21.8 %. The global longitudinal strain is normal. 2. Right ventricular systolic function is normal. The right ventricular size is normal. There is normal pulmonary artery systolic pressure. 3. The mitral valve is normal in structure. Mild mitral valve regurgitation. No evidence of mitral stenosis. 4. The aortic valve is tricuspid. Aortic valve regurgitation is not visualized. No aortic stenosis is present. 5. The inferior vena cava is normal in size with greater than 50% respiratory variability, suggesting right atrial pressure of 3 mmHg.   Neuro/Psych negative neurological ROS  negative psych ROS   GI/Hepatic negative GI ROS, Neg liver ROS,   Endo/Other  negative endocrine ROS  Renal/GU negative Renal ROS     Musculoskeletal negative musculoskeletal ROS (+)   Abdominal   Peds  Hematology HLD   Anesthesia Other Findings Atrial fibrillation  Reproductive/Obstetrics                           Anesthesia Physical Anesthesia Plan  ASA: III  Anesthesia Plan: General   Post-op Pain Management:    Induction: Intravenous  PONV Risk Score and Plan:  2 and Ondansetron, Dexamethasone, Midazolam and Treatment may vary due to age or medical condition  Airway Management Planned: Oral ETT  Additional Equipment:   Intra-op Plan:   Post-operative Plan: Extubation in OR  Informed Consent: I have reviewed the patients History and Physical, chart, labs and discussed the procedure including the risks, benefits and alternatives for the proposed anesthesia with the patient or authorized representative who has indicated his/her understanding and acceptance.     Dental advisory given  Plan Discussed with: CRNA  Anesthesia Plan Comments:        Anesthesia Quick Evaluation

## 2020-01-08 NOTE — Progress Notes (Signed)
After bedrest patient ambulated to bathroom. On return to bed noted small bleeding of right groin dressing. Pressure applied, called Dr Rayann Heman and he ordered 1 more hour of bedrest. Patient's daughter notified. Will continue to monitor.

## 2020-01-08 NOTE — H&P (Signed)
CC: afib   History of Present Illness: [facility] is a 69 y.o. male who presents today for electrophysiology study and ablation for atrial fibrillation and atrial flutter.  The patient presented recently with concerns of palpitations.  Zio was placed which captured afib and atrial flutter (personally reviewed). The patient reports that for several months, he has had nearly daily tachypalpitations.  These last up to several hours.  + anxiety with them.  He has started eliquis and toprol.  Today, he denies symptoms of chest pain, shortness of breath, orthopnea, PND, lower extremity edema, claudication, dizziness, presyncope, syncope, bleeding, or neurologic sequela. The patient is tolerating medications without difficulties and is otherwise without complaint today.        Past Medical History:  Diagnosis Date  . Allergy   . Atrial flutter ([place])   . Hyperlipidemia   . Paroxysmal atrial fibrillation [facility])         Past Surgical History:  Procedure Laterality Date  . HAND SURGERY Right    Broken bone in right hand.  Marland Kitchen MOUTH SURGERY     Age 2  . NASAL SINUS SURGERY    . SKULL FRACTURE ELEVATION     Skull Fracture at age 60            Current Outpatient Medications  Medication Sig Dispense Refill  . apixaban (ELIQUIS) 5 MG TABS tablet Take 1 tablet (5 mg total) by mouth 2 (two) times daily. 180 tablet 1  . atorvastatin (LIPITOR) 20 MG tablet Take 1 tablet (20 mg total) by mouth daily. 90 tablet 1  . cetirizine (ZYRTEC) 10 MG chewable tablet Chew 10 mg by mouth daily.    . metoprolol succinate (TOPROL XL) 25 MG 24 hr tablet Take 1 tablet (25 mg total) by mouth daily. 90 tablet 1  . Multiple Vitamin (MULTIVITAMIN) tablet Take 1 tablet by mouth daily.     No current facility-administered medications for this visit.    Allergies:   Patient has no known allergies.   Social History:  The patient  reports that he has quit smoking. He has never used  smokeless tobacco. He reports that he does not use drugs.   Family History:  The patient's brother has afib and is s/p ablation  ROS:  Please see the history of present illness.   All other systems are personally reviewed and negative.    PHYSICAL EXAM: Vitals:   01/08/20 0536  BP: (!) 136/94  Pulse: 63  Resp: 16  Temp: 97.6 F (36.4 C)  SpO2: 97%    GEN: Well nourished, well developed, in no acute distress HEENT: normal Neck: no JVD  Cardiac: RRR;  ,no edema  Respiratory:    normal work of breathing GI: soft, nontender, nondistended, + BS MS: no deformity or atrophy Skin: warm and dry  Neuro:  Strength and sensation are intact Psych: euthymic mood, full affect    Echo 11/20/19- EF 60%, mild MR, normal atrial size    ASSESSMENT AND PLAN:  1.  Paroxysmal atrial fibrillation/ atrial flutter (Typical) The patient has symptomatic, recurrent paroxysmal atrial fibrillation and atrial flutter he has failed medical therapy with toprol. Chads2vasc score is 1.  he is anticoagulated with eliquis .  He reports compliance with eliquis without interruption.  Cardiac CT reviewed with him at length.     Risk, benefits, and alternatives to EP study and radiofrequency ablation for afib were also discussed in detail today. These risks include but are not limited to stroke,  bleeding, vascular damage, tamponade, perforation, damage to the esophagus, lungs, and other structures, pulmonary vein stenosis, worsening renal function, and death. The patient understands these risk and wishes to proceed.    Thompson Grayer MD, [facility] 01/08/2020 6:45 AM

## 2020-01-08 NOTE — Transfer of Care (Signed)
Immediate Anesthesia Transfer of Care Note  Patient: [name]  Procedure(s) Performed: ATRIAL FIBRILLATION ABLATION (N/A )  Patient Location: Cath Lab  Anesthesia Type:General  Level of Consciousness: awake, alert  and oriented  Airway & Oxygen Therapy: Patient Spontanous Breathing and Patient connected to nasal cannula oxygen  Post-op Assessment: Report given to RN and Post -op Vital signs reviewed and stable  Post vital signs: Reviewed and stable  Last Vitals:  Vitals Value Taken Time  BP    Temp    Pulse 64 01/08/20 1009  Resp 19 01/08/20 1009  SpO2 95 % 01/08/20 1009  Vitals shown include unvalidated device data.  Last Pain:  Vitals:   01/08/20 0602  TempSrc:   PainSc: 0-No pain         Complications: No complications documented.

## 2020-01-08 NOTE — Anesthesia Postprocedure Evaluation (Signed)
Anesthesia Post Note  Patient: [facility]  Procedure(s) Performed: ATRIAL FIBRILLATION ABLATION (N/A )     Patient location during evaluation: Cath Lab Anesthesia Type: General Level of consciousness: awake Pain management: pain level controlled Vital Signs Assessment: post-procedure vital signs reviewed and stable Respiratory status: spontaneous breathing, nonlabored ventilation, respiratory function stable and patient connected to nasal cannula oxygen Cardiovascular status: blood pressure returned to baseline and stable Postop Assessment: no apparent nausea or vomiting Anesthetic complications: no   No complications documented.  Last Vitals:  Vitals:   01/08/20 1401 01/08/20 1439  BP: 125/66 128/68  Pulse: 63 69  Resp: 18 12  Temp:    SpO2: 96% 97%    Last Pain:  Vitals:   01/08/20 1401  TempSrc:   PainSc: 0-No pain                 Icess Bertoni P Laurella Tull

## 2020-01-09 ENCOUNTER — Encounter (HOSPITAL_COMMUNITY): Payer: Self-pay | Admitting: Internal Medicine

## 2020-01-09 LAB — POCT ACTIVATED CLOTTING TIME: Activated Clotting Time: 333 seconds

## 2020-02-05 ENCOUNTER — Other Ambulatory Visit: Payer: Self-pay

## 2020-02-05 ENCOUNTER — Ambulatory Visit (HOSPITAL_COMMUNITY)
Admission: RE | Admit: 2020-02-05 | Discharge: 2020-02-05 | Disposition: A | Discharge status: Home / Self Care | Payer: PPO | Source: Ambulatory Visit | Attending: Nurse Practitioner | Admitting: Nurse Practitioner

## 2020-02-05 ENCOUNTER — Encounter (HOSPITAL_COMMUNITY): Payer: Self-pay | Admitting: Nurse Practitioner

## 2020-02-05 VITALS — BP 122/76 | HR 69 | Ht 69.75 in | Wt 194.8 lb

## 2020-02-05 DIAGNOSIS — I4891 Unspecified atrial fibrillation: Secondary | ICD-10-CM | POA: Insufficient documentation

## 2020-02-05 DIAGNOSIS — D6869 Other thrombophilia: Secondary | ICD-10-CM | POA: Diagnosis not present

## 2020-02-05 DIAGNOSIS — Z7901 Long term (current) use of anticoagulants: Secondary | ICD-10-CM | POA: Diagnosis not present

## 2020-02-05 DIAGNOSIS — Z87891 Personal history of nicotine dependence: Secondary | ICD-10-CM | POA: Insufficient documentation

## 2020-02-05 DIAGNOSIS — Z79899 Other long term (current) drug therapy: Secondary | ICD-10-CM | POA: Diagnosis not present

## 2020-02-05 DIAGNOSIS — I480 Paroxysmal atrial fibrillation: Secondary | ICD-10-CM

## 2020-02-05 NOTE — Progress Notes (Signed)
Primary Care Physician: Shon Baton, MD Referring Physician:Dr. Allred Cardiologist: Dr. Rolanda Lundborg Michael Hopkins is a 70 y.o. male with a h/o afib that had ablation one month ago. He reports that he is doing well. Has only noted an irregular HR 1-2 x's , lasting just a few minutes. No swallowing or groin issues. He is back to playing pickle ball. One strip reviewed was SR with PAC's.  Today, he denies symptoms of palpitations, chest pain, shortness of breath, orthopnea, PND, lower extremity edema, dizziness, presyncope, syncope, or neurologic sequela. The patient is tolerating medications without difficulties and is otherwise without complaint today.   Past Medical History:  Diagnosis Date  . Allergy   . Atrial flutter ([place])   . Hyperlipidemia   . Paroxysmal atrial fibrillation [facility])    Past Surgical History:  Procedure Laterality Date  . ATRIAL FIBRILLATION ABLATION N/A 01/08/2020   Procedure: ATRIAL FIBRILLATION ABLATION;  Surgeon: Thompson Grayer, MD;  Location: [place] [facility];  Service: Cardiovascular;  Laterality: N/A;  . HAND SURGERY Right    Broken bone in right hand.  Marland Kitchen MOUTH SURGERY     Age 86  . NASAL SINUS SURGERY    . SKULL FRACTURE ELEVATION     Skull Fracture at age 89     Current Outpatient Medications  Medication Sig Dispense Refill  . apixaban (ELIQUIS) 5 MG TABS tablet Take 1 tablet (5 mg total) by mouth 2 (two) times daily. 180 tablet 1  . atorvastatin (LIPITOR) 20 MG tablet Take 1 tablet (20 mg total) by mouth daily. 90 tablet 1  . cetirizine (ZYRTEC) 10 MG chewable tablet Chew 10 mg by mouth daily.    . metoprolol succinate (TOPROL XL) 25 MG 24 hr tablet Take 1 tablet (25 mg total) by mouth daily. 90 tablet 1  . Multiple Vitamin (MULTIVITAMIN) tablet Take 1 tablet by mouth daily.    . pantoprazole (PROTONIX) 40 MG tablet Take 1 tablet (40 mg total) by mouth daily. 45 tablet 0   No current facility-administered medications for this encounter.     No Known Allergies  Social History   Socioeconomic History  . Marital status: Divorced    Spouse name: Not on file  . Number of children: Not on file  . Years of education: Not on file  . Highest education level: Not on file  Occupational History  . Not on file  Tobacco Use  . Smoking status: Former Research scientist (life sciences)  . Smokeless tobacco: Never Used  . Tobacco comment: Quit 10 years ago  Substance and Sexual Activity  . Alcohol use: Yes    Alcohol/week: 3.0 standard drinks    Types: 2 Cans of beer, 1 Standard drinks or equivalent per week  . Drug use: Never  . Sexual activity: Not on file  Other Topics Concern  . Not on file  Social History Narrative   Lives alone in [place].   Divorced   Retired Chiropractor of Radio broadcast assistant Strain: Not on [employer] Insecurity: Not on file  Transportation Needs: Not on file  Physical Activity: Not on file  Stress: Not on file  Social Connections: Not on file  Intimate Partner Violence: Not on file    Family History  Problem Relation Age of Onset  . Colon cancer Neg Hx   . Esophageal cancer Neg Hx   . Rectal cancer Neg Hx   . Stomach cancer Neg Hx  ROS- All systems are reviewed and negative except as per the HPI above  Physical Exam: Vitals:   02/05/20 1534  BP: 122/76  Pulse: 69  Weight: 88.4 kg  Height: 5' 9.75" (1.772 m)   Wt Readings from Last 3 Encounters:  02/05/20 88.4 kg  01/08/20 83.9 kg  12/05/19 86.6 kg    Labs: Lab Results  Component Value Date   NA 140 12/19/2019   K 4.7 12/19/2019   CL 105 12/19/2019   CO2 26 12/19/2019   GLUCOSE 91 12/19/2019   BUN 15 12/19/2019   CREATININE 0.82 12/19/2019   CALCIUM 9.1 12/19/2019   No results found for: INR No results found for: CHOL, HDL, LDLCALC, TRIG   GEN- The patient is well appearing, alert and oriented x 3 today.   Head- normocephalic, atraumatic Eyes-  Sclera clear, conjunctiva pink Ears- hearing  intact Oropharynx- clear Neck- supple, no JVP Lymph- no cervical lymphadenopathy Lungs- Clear to ausculation bilaterally, normal work of breathing Heart- Regular rate and rhythm, no murmurs, rubs or gallops, PMI not laterally displaced GI- soft, NT, ND, + BS Extremities- no clubbing, cyanosis, or edema MS- no significant deformity or atrophy Skin- no rash or lesion Psych- euthymic mood, full affect Neuro- strength and sensation are intact  EKG-NSR at 69 bpm, pr int 172 bpm, qrs int 98 ms, qtc 407 ms     Assessment and Plan: 1. afib S/p ablation 01/07/21 Doing well without any afib Continue toprol xl 25 mg qd   2. CHA2DS2VASc score of 1 Continue eliquis 5 mg bid  F/u with Dr. Rayann Heman 04/07/20  Geroge Baseman. Ian Cavey, [facility] [address] [place], [place] [address] [phone]

## 2020-04-07 ENCOUNTER — Encounter: Payer: Self-pay | Admitting: Internal Medicine

## 2020-04-07 ENCOUNTER — Ambulatory Visit: Payer: PPO | Admitting: Internal Medicine

## 2020-04-07 ENCOUNTER — Other Ambulatory Visit: Payer: Self-pay

## 2020-04-07 VITALS — BP 124/74 | HR 64 | Ht 70.0 in | Wt 191.0 lb

## 2020-04-07 DIAGNOSIS — I480 Paroxysmal atrial fibrillation: Secondary | ICD-10-CM

## 2020-04-07 DIAGNOSIS — I483 Typical atrial flutter: Secondary | ICD-10-CM | POA: Diagnosis not present

## 2020-04-07 MED ORDER — METOPROLOL SUCCINATE ER 25 MG PO TB24: 12.5000 mg | ORAL_TABLET | Freq: Every day | ORAL | 0 refills | Status: DC

## 2020-04-07 NOTE — Progress Notes (Signed)
   PCP: Shon Baton, MD Primary Cardiologist: Dr Toniann Ket is a 70 y.o. male who presents today for routine electrophysiology followup.  Since his recent afib ablation, the patient reports doing very well.  he denies procedure related complications and is pleased with the results of the procedure.  Today, he denies symptoms of palpitations, chest pain, shortness of breath,  lower extremity edema, dizziness, presyncope, or syncope.  The patient is otherwise without complaint today.   Past Medical History:  Diagnosis Date  . Allergy   . Atrial flutter ([place])   . Hyperlipidemia   . Paroxysmal atrial fibrillation [facility])    Past Surgical History:  Procedure Laterality Date  . ATRIAL FIBRILLATION ABLATION N/A 01/08/2020   Procedure: ATRIAL FIBRILLATION ABLATION;  Surgeon: Thompson Grayer, MD;  Location: [place] [facility];  Service: Cardiovascular;  Laterality: N/A;  . HAND SURGERY Right    Broken bone in right hand.  Marland Kitchen MOUTH SURGERY     Age 69  . NASAL SINUS SURGERY    . SKULL FRACTURE ELEVATION     Skull Fracture at age 65     ROS- all systems are personally reviewed and negatives except as per HPI above  Current Outpatient Medications  Medication Sig Dispense Refill  . apixaban (ELIQUIS) 5 MG TABS tablet Take 1 tablet (5 mg total) by mouth 2 (two) times daily. 180 tablet 1  . atorvastatin (LIPITOR) 20 MG tablet Take 1 tablet (20 mg total) by mouth daily. 90 tablet 1  . cetirizine (ZYRTEC) 10 MG chewable tablet Chew 10 mg by mouth daily.    . metoprolol succinate (TOPROL XL) 25 MG 24 hr tablet Take 1 tablet (25 mg total) by mouth daily. 90 tablet 1  . Multiple Vitamin (MULTIVITAMIN) tablet Take 1 tablet by mouth daily.     No current facility-administered medications for this visit.    Physical Exam: Vitals:   04/07/20 1557  BP: 124/74  Pulse: 64  SpO2: 97%  Weight: 191 lb (86.6 kg)  Height: 5\' 10"  (1.778 m)    GEN- The patient is well appearing, alert and  oriented x 3 today.   Head- normocephalic, atraumatic Eyes-  Sclera clear, conjunctiva pink Ears- hearing intact Oropharynx- clear Lungs- Clear to ausculation bilaterally, normal work of breathing Heart- Regular rate and rhythm, no murmurs, rubs or gallops, PMI not laterally displaced GI- soft, NT, ND, + BS Extremities- no clubbing, cyanosis, or edema  EKG tracing ordered today is personally reviewed and shows sinus  Assessment and Plan:  1. Paroxysmal atrial fibrillation. Atrial flutter Doing well s/p ablation chads2vasc score is 1 He wishes to stop eliquis  reduce toprol to 12.5mg  daily x 3 weeks then discontinue    Return to see me in 4 months  Thompson Grayer MD, [facility] 04/07/2020 4:00 PM

## 2020-04-07 NOTE — Patient Instructions (Addendum)
Medication Instructions:  Stop Eliquis  Decrease your Metoprolol succinate to 12.5mg  daily for 3 weeks, then stop.  Your physician recommends that you continue on your current medications as directed. Please refer to the Current Medication list given to you today.  Labwork: None ordered.  Testing/Procedures: None ordered.  Follow-Up: Your physician wants you to follow-up in: 08/14/20 at 2 pm with Thompson Grayer, MD  Any Other Special Instructions Will Be Listed Below (If Applicable).  If you need a refill on your cardiac medications before your next appointment, please call your pharmacy.

## 2020-04-18 DIAGNOSIS — Z125 Encounter for screening for malignant neoplasm of prostate: Secondary | ICD-10-CM | POA: Diagnosis not present

## 2020-04-18 DIAGNOSIS — E785 Hyperlipidemia, unspecified: Secondary | ICD-10-CM | POA: Diagnosis not present

## 2020-04-22 DIAGNOSIS — R351 Nocturia: Secondary | ICD-10-CM | POA: Diagnosis not present

## 2020-04-22 DIAGNOSIS — E785 Hyperlipidemia, unspecified: Secondary | ICD-10-CM | POA: Diagnosis not present

## 2020-04-22 DIAGNOSIS — Z1212 Encounter for screening for malignant neoplasm of rectum: Secondary | ICD-10-CM | POA: Diagnosis not present

## 2020-04-22 DIAGNOSIS — R7989 Other specified abnormal findings of blood chemistry: Secondary | ICD-10-CM | POA: Diagnosis not present

## 2020-04-22 DIAGNOSIS — Z1331 Encounter for screening for depression: Secondary | ICD-10-CM | POA: Diagnosis not present

## 2020-04-22 DIAGNOSIS — J302 Other seasonal allergic rhinitis: Secondary | ICD-10-CM | POA: Diagnosis not present

## 2020-04-22 DIAGNOSIS — R82998 Other abnormal findings in urine: Secondary | ICD-10-CM | POA: Diagnosis not present

## 2020-04-22 DIAGNOSIS — R361 Hematospermia: Secondary | ICD-10-CM | POA: Diagnosis not present

## 2020-04-22 DIAGNOSIS — I700 Atherosclerosis of aorta: Secondary | ICD-10-CM | POA: Diagnosis not present

## 2020-04-22 DIAGNOSIS — E669 Obesity, unspecified: Secondary | ICD-10-CM | POA: Diagnosis not present

## 2020-04-22 DIAGNOSIS — Z0000 Encounter for general adult medical examination without abnormal findings: Secondary | ICD-10-CM | POA: Diagnosis not present

## 2020-04-22 DIAGNOSIS — I482 Chronic atrial fibrillation, unspecified: Secondary | ICD-10-CM | POA: Diagnosis not present

## 2020-04-22 DIAGNOSIS — I2584 Coronary atherosclerosis due to calcified coronary lesion: Secondary | ICD-10-CM | POA: Diagnosis not present

## 2020-05-05 DIAGNOSIS — Z1211 Encounter for screening for malignant neoplasm of colon: Secondary | ICD-10-CM | POA: Diagnosis not present

## 2020-05-05 DIAGNOSIS — Z1212 Encounter for screening for malignant neoplasm of rectum: Secondary | ICD-10-CM | POA: Diagnosis not present

## 2020-05-12 DIAGNOSIS — S93602A Unspecified sprain of left foot, initial encounter: Secondary | ICD-10-CM | POA: Diagnosis not present

## 2020-05-13 ENCOUNTER — Other Ambulatory Visit: Payer: Self-pay | Admitting: Orthopedic Surgery

## 2020-05-13 DIAGNOSIS — M79672 Pain in left foot: Secondary | ICD-10-CM

## 2020-05-15 ENCOUNTER — Other Ambulatory Visit: Payer: Self-pay

## 2020-05-15 ENCOUNTER — Ambulatory Visit
Admission: RE | Admit: 2020-05-15 | Discharge: 2020-05-15 | Disposition: A | Discharge status: Home / Self Care | Payer: PPO | Source: Ambulatory Visit | Attending: Orthopedic Surgery | Admitting: Orthopedic Surgery

## 2020-05-15 DIAGNOSIS — M79672 Pain in left foot: Secondary | ICD-10-CM | POA: Diagnosis not present

## 2020-05-20 ENCOUNTER — Other Ambulatory Visit: Payer: PPO

## 2020-05-20 ENCOUNTER — Other Ambulatory Visit: Payer: Self-pay

## 2020-05-20 MED ORDER — ATORVASTATIN CALCIUM 20 MG PO TABS: 20.0000 mg | ORAL_TABLET | Freq: Every day | ORAL | 1 refills | Status: DC

## 2020-07-31 DIAGNOSIS — L814 Other melanin hyperpigmentation: Secondary | ICD-10-CM | POA: Diagnosis not present

## 2020-07-31 DIAGNOSIS — L821 Other seborrheic keratosis: Secondary | ICD-10-CM | POA: Diagnosis not present

## 2020-07-31 DIAGNOSIS — L570 Actinic keratosis: Secondary | ICD-10-CM | POA: Diagnosis not present

## 2020-08-14 ENCOUNTER — Other Ambulatory Visit: Payer: Self-pay

## 2020-08-14 ENCOUNTER — Ambulatory Visit: Payer: PPO | Admitting: Internal Medicine

## 2020-08-14 ENCOUNTER — Encounter: Payer: Self-pay | Admitting: Internal Medicine

## 2020-08-14 VITALS — BP 114/74 | HR 77 | Ht 70.0 in | Wt 191.4 lb

## 2020-08-14 DIAGNOSIS — I480 Paroxysmal atrial fibrillation: Secondary | ICD-10-CM

## 2020-08-14 DIAGNOSIS — I483 Typical atrial flutter: Secondary | ICD-10-CM

## 2020-08-14 NOTE — Progress Notes (Signed)
   PCP: Shon Baton, MD Primary Cardiologist: Dr Johney Frame Primary EP: Dr Rayann Heman  Michael Hopkins is a 70 y.o. male who presents today for routine electrophysiology followup.  Since last being seen in our clinic, the patient reports doing very well.  Today, he denies symptoms of palpitations, chest pain, shortness of breath,  lower extremity edema, dizziness, presyncope, or syncope.  The patient is otherwise without complaint today.   Past Medical History:  Diagnosis Date   Allergy    Atrial flutter ([place])    Hyperlipidemia    Paroxysmal atrial fibrillation ([place])    Past Surgical History:  Procedure Laterality Date   ATRIAL FIBRILLATION ABLATION N/A 01/08/2020   Procedure: ATRIAL FIBRILLATION ABLATION;  Surgeon: Thompson Grayer, MD;  Location: [place] [facility];  Service: Cardiovascular;  Laterality: N/A;   HAND SURGERY Right    Broken bone in right hand.   MOUTH SURGERY     Age 67   NASAL SINUS SURGERY     SKULL FRACTURE ELEVATION     Skull Fracture at age 45     ROS- all systems are reviewed and negatives except as per HPI above  Current Outpatient Medications  Medication Sig Dispense Refill   atorvastatin (LIPITOR) 20 MG tablet Take 1 tablet (20 mg total) by mouth daily. 90 tablet 1   cetirizine (ZYRTEC) 10 MG chewable tablet Chew 10 mg by mouth daily.     Multiple Vitamin (MULTIVITAMIN) tablet Take 1 tablet by mouth daily.     No current facility-administered medications for this visit.    Physical Exam: Vitals:   08/14/20 1403  BP: 114/74  Pulse: 77  SpO2: 95%  Weight: 191 lb 6.4 oz (86.8 kg)  Height: '5\' 10"'$  (1.778 m)    GEN- The patient is well appearing, alert and oriented x 3 today.   Head- normocephalic, atraumatic Eyes-  Sclera clear, conjunctiva pink Ears- hearing intact Oropharynx- clear Lungs- Clear to ausculation bilaterally, normal work of breathing Heart- Regular rate and rhythm, no murmurs, rubs or gallops, PMI not laterally displaced GI- soft, NT,  ND, + BS Extremities- no clubbing, cyanosis, or edema  Wt Readings from Last 3 Encounters:  08/14/20 191 lb 6.4 oz (86.8 kg)  04/07/20 191 lb (86.6 kg)  02/05/20 194 lb 12.8 oz (88.4 kg)    EKG tracing ordered today is personally reviewed and shows sinus  Assessment and Plan:  Paroxysmal atrial fibrillation/ atrial flutter Doing well post ablation Very pleased with results Chads2vasc score is 1.  He is not on [place] as per guidelines.  Return in 12 months  Thompson Grayer MD, [facility] 08/14/2020 2:25 PM

## 2020-08-14 NOTE — Patient Instructions (Addendum)
Medication Instructions:  Your physician recommends that you continue on your current medications as directed. Please refer to the Current Medication list given to you today.  Labwork: None ordered.  Testing/Procedures: None ordered.  Follow-Up: Your physician wants you to follow-up in: 08/17/21 at 3 pm  with Thompson Grayer, MD    Any Other Special Instructions Will Be Listed Below (If Applicable).  If you need a refill on your cardiac medications before your next appointment, please call your pharmacy.

## 2020-11-10 ENCOUNTER — Other Ambulatory Visit: Payer: Self-pay

## 2020-11-10 MED ORDER — ATORVASTATIN CALCIUM 20 MG PO TABS: 20.0000 mg | ORAL_TABLET | Freq: Every day | ORAL | 2 refills | Status: DC

## 2021-04-29 DIAGNOSIS — E785 Hyperlipidemia, unspecified: Secondary | ICD-10-CM | POA: Diagnosis not present

## 2021-04-29 DIAGNOSIS — I700 Atherosclerosis of aorta: Secondary | ICD-10-CM | POA: Diagnosis not present

## 2021-04-29 DIAGNOSIS — R7989 Other specified abnormal findings of blood chemistry: Secondary | ICD-10-CM | POA: Diagnosis not present

## 2021-04-29 DIAGNOSIS — Z125 Encounter for screening for malignant neoplasm of prostate: Secondary | ICD-10-CM | POA: Diagnosis not present

## 2021-05-07 DIAGNOSIS — I700 Atherosclerosis of aorta: Secondary | ICD-10-CM | POA: Diagnosis not present

## 2021-05-07 DIAGNOSIS — Z1331 Encounter for screening for depression: Secondary | ICD-10-CM | POA: Diagnosis not present

## 2021-05-07 DIAGNOSIS — E785 Hyperlipidemia, unspecified: Secondary | ICD-10-CM | POA: Diagnosis not present

## 2021-05-07 DIAGNOSIS — Z0000 Encounter for general adult medical examination without abnormal findings: Secondary | ICD-10-CM | POA: Diagnosis not present

## 2021-05-07 DIAGNOSIS — Z1389 Encounter for screening for other disorder: Secondary | ICD-10-CM | POA: Diagnosis not present

## 2021-05-07 DIAGNOSIS — R7989 Other specified abnormal findings of blood chemistry: Secondary | ICD-10-CM | POA: Diagnosis not present

## 2021-05-07 DIAGNOSIS — I2584 Coronary atherosclerosis due to calcified coronary lesion: Secondary | ICD-10-CM | POA: Diagnosis not present

## 2021-05-07 DIAGNOSIS — Z1212 Encounter for screening for malignant neoplasm of rectum: Secondary | ICD-10-CM | POA: Diagnosis not present

## 2021-05-07 DIAGNOSIS — R82998 Other abnormal findings in urine: Secondary | ICD-10-CM | POA: Diagnosis not present

## 2021-05-07 DIAGNOSIS — R351 Nocturia: Secondary | ICD-10-CM | POA: Diagnosis not present

## 2021-05-07 DIAGNOSIS — E669 Obesity, unspecified: Secondary | ICD-10-CM | POA: Diagnosis not present

## 2021-05-07 DIAGNOSIS — I482 Chronic atrial fibrillation, unspecified: Secondary | ICD-10-CM | POA: Diagnosis not present

## 2021-07-31 DIAGNOSIS — Z85828 Personal history of other malignant neoplasm of skin: Secondary | ICD-10-CM | POA: Diagnosis not present

## 2021-07-31 DIAGNOSIS — C44519 Basal cell carcinoma of skin of other part of trunk: Secondary | ICD-10-CM | POA: Diagnosis not present

## 2021-07-31 DIAGNOSIS — L821 Other seborrheic keratosis: Secondary | ICD-10-CM | POA: Diagnosis not present

## 2021-07-31 DIAGNOSIS — D225 Melanocytic nevi of trunk: Secondary | ICD-10-CM | POA: Diagnosis not present

## 2021-07-31 DIAGNOSIS — L814 Other melanin hyperpigmentation: Secondary | ICD-10-CM | POA: Diagnosis not present

## 2021-07-31 DIAGNOSIS — L570 Actinic keratosis: Secondary | ICD-10-CM | POA: Diagnosis not present

## 2021-08-08 ENCOUNTER — Other Ambulatory Visit: Payer: Self-pay | Admitting: Internal Medicine

## 2021-08-17 ENCOUNTER — Ambulatory Visit: Payer: PPO | Admitting: Internal Medicine

## 2021-08-20 ENCOUNTER — Ambulatory Visit (HOSPITAL_BASED_OUTPATIENT_CLINIC_OR_DEPARTMENT_OTHER): Payer: PPO | Admitting: Internal Medicine

## 2021-08-20 ENCOUNTER — Encounter (HOSPITAL_BASED_OUTPATIENT_CLINIC_OR_DEPARTMENT_OTHER): Payer: Self-pay | Admitting: Internal Medicine

## 2021-08-20 DIAGNOSIS — I480 Paroxysmal atrial fibrillation: Secondary | ICD-10-CM

## 2021-08-20 DIAGNOSIS — I4892 Unspecified atrial flutter: Secondary | ICD-10-CM

## 2021-08-20 NOTE — Patient Instructions (Addendum)
Medication Instructions:  Your physician recommends that you continue on your current medications as directed. Please refer to the Current Medication list given to you today. *If you need a refill on your cardiac medications before your next appointment, please call your pharmacy*  Lab Work: None ordered. If you have labs (blood work) drawn today and your tests are completely normal, you will receive your results only by: [place] (if you have MyChart) OR A paper copy in the mail If you have any lab test that is abnormal or we need to change your treatment, we will call you to review the results.  Testing/Procedures: None ordered.  Follow-Up: At [facility], you and your health needs are our priority.  As part of our continuing mission to provide you with exceptional heart care, we have created designated Provider Care Teams.  These Care Teams include your primary Cardiologist (physician) and Advanced Practice Providers (APPs -  Physician Assistants and Nurse Practitioners) who all work together to provide you with the care you need, when you need it.  Your next appointment:   Your physician wants you to follow-up in: one year with the [facility]IB Clinic.  You will receive a reminder letter in the mail two months in advance. If you don't receive a letter, please call our office to schedule the follow-up appointment.   Important Information About Sugar

## 2021-08-20 NOTE — Progress Notes (Signed)
    PCP: Shon Baton, MD Primary Cardiologist: Dr Johney Frame Primary EP: Dr Rayann Heman  Michael Hopkins is a 71 y.o. male who presents today for routine electrophysiology followup.  Since last being seen in our clinic, the patient reports doing very well.  Today, he denies symptoms of palpitations, chest pain, shortness of breath,  lower extremity edema, dizziness, presyncope, or syncope.  The patient is otherwise without complaint today.   Past Medical History:  Diagnosis Date   Allergy    Atrial flutter ([place])    Hyperlipidemia    Paroxysmal atrial fibrillation ([place])    Past Surgical History:  Procedure Laterality Date   ATRIAL FIBRILLATION ABLATION N/A 01/08/2020   Procedure: ATRIAL FIBRILLATION ABLATION;  Surgeon: Thompson Grayer, MD;  Location: [place] [facility];  Service: Cardiovascular;  Laterality: N/A;   HAND SURGERY Right    Broken bone in right hand.   MOUTH SURGERY     Age 33   NASAL SINUS SURGERY     SKULL FRACTURE ELEVATION     Skull Fracture at age 44     ROS- all systems are reviewed and negatives except as per HPI above  Current Outpatient Medications  Medication Sig Dispense Refill   aspirin EC 81 MG tablet Take 81 mg by mouth daily. Take half     atorvastatin (LIPITOR) 20 MG tablet TAKE 1 TABLET BY MOUTH EVERY DAY 30 tablet 0   Multiple Vitamin (MULTIVITAMIN) tablet Take 1 tablet by mouth daily.     Naproxen Sodium (ALEVE) 220 MG CAPS Take by mouth daily at 6 (six) AM.     No current facility-administered medications for this visit.    Physical Exam: Vitals:   08/20/21 1611  BP: 124/72  Pulse: 66  Weight: 196 lb (88.9 kg)  Height: 5' 9.5" (1.765 m)    GEN- The patient is well appearing, alert and oriented x 3 today.   Head- normocephalic, atraumatic Eyes-  Sclera clear, conjunctiva pink Ears- hearing intact Oropharynx- clear Lungs- Clear to ausculation bilaterally, normal work of breathing Heart- Regular rate and rhythm, no murmurs, rubs or gallops,  PMI not laterally displaced GI- soft, NT, ND, + BS Extremities- no clubbing, cyanosis, or edema  Wt Readings from Last 3 Encounters:  08/20/21 196 lb (88.9 kg)  08/14/20 191 lb 6.4 oz (86.8 kg)  04/07/20 191 lb (86.6 kg)    EKG tracing ordered today is personally reviewed and shows sinus rhythm 66 bpm, PR 194 msec, QRS 94 msec, Qtc 396 msec  Assessment and Plan:  Paroxysmal atrial fibrillation/ atrial flutter Resolved post ablation Chads2vasc score is 1.  Does not require [place]  Follow-up in the AF clinic every 1-2 years I will see when needed  Thompson Grayer MD, [facility] 08/20/2021 4:24 PM

## 2021-08-21 NOTE — Addendum Note (Signed)
Addended by: Loel Dubonnet on: 08/21/2021 07:41 AM   Modules accepted: Orders

## 2021-08-31 DIAGNOSIS — I700 Atherosclerosis of aorta: Secondary | ICD-10-CM | POA: Diagnosis not present

## 2021-08-31 DIAGNOSIS — R0602 Shortness of breath: Secondary | ICD-10-CM | POA: Diagnosis not present

## 2021-08-31 DIAGNOSIS — I482 Chronic atrial fibrillation, unspecified: Secondary | ICD-10-CM | POA: Diagnosis not present

## 2021-08-31 DIAGNOSIS — I2584 Coronary atherosclerosis due to calcified coronary lesion: Secondary | ICD-10-CM | POA: Diagnosis not present

## 2021-09-02 ENCOUNTER — Other Ambulatory Visit (HOSPITAL_COMMUNITY): Payer: Self-pay | Admitting: Radiology

## 2021-09-02 DIAGNOSIS — R0602 Shortness of breath: Secondary | ICD-10-CM

## 2021-09-04 ENCOUNTER — Ambulatory Visit (HOSPITAL_COMMUNITY)
Admission: RE | Admit: 2021-09-04 | Discharge: 2021-09-04 | Disposition: A | Discharge status: Home / Self Care | Payer: PPO | Source: Ambulatory Visit | Attending: Internal Medicine | Admitting: Internal Medicine

## 2021-09-04 DIAGNOSIS — R0602 Shortness of breath: Secondary | ICD-10-CM | POA: Diagnosis not present

## 2021-09-04 LAB — PULMONARY FUNCTION TEST
DL/VA % pred: 79 %
DL/VA: 3.19 ml/min/mmHg/L
DLCO unc % pred: 84 %
DLCO unc: 21.43 ml/min/mmHg
FEF 25-75 Post: 2.24 L/sec
FEF 25-75 Pre: 1.65 L/sec
FEF2575-%Change-Post: 35 %
FEF2575-%Pred-Post: 94 %
FEF2575-%Pred-Pre: 69 %
FEV1-%Change-Post: 7 %
FEV1-%Pred-Post: 95 %
FEV1-%Pred-Pre: 88 %
FEV1-Post: 3.02 L
FEV1-Pre: 2.81 L
FEV1FVC-%Change-Post: 2 %
FEV1FVC-%Pred-Pre: 94 %
FEV6-%Change-Post: 6 %
FEV6-%Pred-Post: 101 %
FEV6-%Pred-Pre: 95 %
FEV6-Post: 4.13 L
FEV6-Pre: 3.89 L
FEV6FVC-%Change-Post: 1 %
FEV6FVC-%Pred-Post: 103 %
FEV6FVC-%Pred-Pre: 101 %
FVC-%Change-Post: 4 %
FVC-%Pred-Post: 98 %
FVC-%Pred-Pre: 94 %
FVC-Post: 4.25 L
FVC-Pre: 4.05 L
Post FEV1/FVC ratio: 71 %
Post FEV6/FVC ratio: 97 %
Pre FEV1/FVC ratio: 69 %
Pre FEV6/FVC Ratio: 96 %
RV % pred: 133 %
RV: 3.26 L
TLC % pred: 111 %
TLC: 7.7 L

## 2021-09-04 MED ORDER — ALBUTEROL SULFATE (2.5 MG/3ML) 0.083% IN NEBU
2.5000 mg | INHALATION_SOLUTION | Freq: Once | RESPIRATORY_TRACT | Status: AC
  Administered 2021-09-04: 2.5 mg via RESPIRATORY_TRACT

## 2021-09-09 ENCOUNTER — Other Ambulatory Visit: Payer: Self-pay | Admitting: Internal Medicine

## 2021-11-03 DIAGNOSIS — H02834 Dermatochalasis of left upper eyelid: Secondary | ICD-10-CM | POA: Diagnosis not present

## 2021-11-03 DIAGNOSIS — H02831 Dermatochalasis of right upper eyelid: Secondary | ICD-10-CM | POA: Diagnosis not present

## 2022-01-07 ENCOUNTER — Other Ambulatory Visit: Payer: Self-pay | Admitting: Cardiology

## 2022-01-08 ENCOUNTER — Ambulatory Visit: Payer: PPO | Attending: Cardiology | Admitting: Cardiology

## 2022-01-08 ENCOUNTER — Encounter: Payer: Self-pay | Admitting: Cardiology

## 2022-01-08 VITALS — BP 118/66 | HR 87 | Ht 69.5 in | Wt 200.2 lb

## 2022-01-08 DIAGNOSIS — I480 Paroxysmal atrial fibrillation: Secondary | ICD-10-CM

## 2022-01-08 DIAGNOSIS — I483 Typical atrial flutter: Secondary | ICD-10-CM

## 2022-01-08 NOTE — Progress Notes (Signed)
Electrophysiology Office Note   Date:  01/08/2022   ID:  Michael Hopkins, DOB [birth year], MRN [id number]  PCP:  Shon Baton, MD  Cardiologist:  Johney Frame Primary Electrophysiologist:  Trishna Cwik Meredith Leeds, MD    Chief Complaint: AF   History of Present Illness: Michael Hopkins is a 71 y.o. male who is being seen today for the evaluation of AF at the request of Shon Baton, MD. Presenting today for electrophysiology evaluation.  He has a history significant for atrial fibrillation/flutter.  He is status post ablation in 2021.  He had an episode immediately after ablation but has not had no further episodes since then.  He is currently not anticoagulated due to low stroke risk.  Today, he denies symptoms of palpitations, chest pain, shortness of breath, orthopnea, PND, lower extremity edema, claudication, dizziness, presyncope, syncope, bleeding, or neurologic sequela. The patient is tolerating medications without difficulties.    Past Medical History:  Diagnosis Date   Allergy    Atrial flutter ([place])    Hyperlipidemia    Paroxysmal atrial fibrillation ([place])    Past Surgical History:  Procedure Laterality Date   ATRIAL FIBRILLATION ABLATION N/A 01/08/2020   Procedure: ATRIAL FIBRILLATION ABLATION;  Surgeon: Thompson Grayer, MD;  Location: [place] [facility];  Service: Cardiovascular;  Laterality: N/A;   HAND SURGERY Right    Broken bone in right hand.   MOUTH SURGERY     Age 71   NASAL SINUS SURGERY     SKULL FRACTURE ELEVATION     Skull Fracture at age 58      Current Outpatient Medications  Medication Sig Dispense Refill   aspirin EC 81 MG tablet Take by mouth daily. Take 1/2 tab daily     atorvastatin (LIPITOR) 20 MG tablet TAKE 1 TABLET BY MOUTH EVERY DAY 30 tablet 3   Multiple Vitamin (MULTIVITAMIN) tablet Take 1 tablet by mouth daily.     Naproxen Sodium (ALEVE) 220 MG CAPS Take by mouth daily at 6 (six) AM.     sildenafil (REVATIO) 20 MG tablet Take 20 mg by mouth 2  (two) times daily as needed.     No current facility-administered medications for this visit.    Allergies:   Patient has no known allergies.   Social History:  The patient  reports that he has quit smoking. He has never used smokeless tobacco. He reports current alcohol use of about 3.0 standard drinks of alcohol per week. He reports that he does not use drugs.   Family History:  The patient's family history is not on file.    ROS:  Please see the history of present illness.   Otherwise, review of systems is positive for none.   All other systems are reviewed and negative.    PHYSICAL EXAM: VS:  BP 118/66   Pulse 87   Ht 5' 9.5" (1.765 m)   Wt 200 lb 3.2 oz (90.8 kg)   SpO2 96%   BMI 29.14 kg/m  , BMI Body mass index is 29.14 kg/m. GEN: Well nourished, well developed, in no acute distress  HEENT: normal  Neck: no JVD, carotid bruits, or masses Cardiac: RRR; no murmurs, rubs, or gallops,no edema  Respiratory:  clear to auscultation bilaterally, normal work of breathing GI: soft, nontender, nondistended, + BS MS: no deformity or atrophy  Skin: warm and dry Neuro:  Strength and sensation are intact Psych: euthymic mood, full affect  EKG:  EKG is ordered today. Personal review of the ekg ordered  shows sinus rhythm, rate 87  Recent Labs: No results found for requested labs within last 365 days.    Lipid Panel  No results found for: "CHOL", "TRIG", "HDL", "CHOLHDL", "VLDL", "LDLCALC", "LDLDIRECT"   Wt Readings from Last 3 Encounters:  01/08/22 200 lb 3.2 oz (90.8 kg)  08/20/21 196 lb (88.9 kg)  08/14/20 191 lb 6.4 oz (86.8 kg)      Other studies Reviewed: Additional studies/ records that were reviewed today include: TTE 11/20/19  Review of the above records today demonstrates:   1. Left ventricular ejection fraction, by estimation, is 60 to 65%. The  left ventricle has normal function. The left ventricle has no regional  wall motion abnormalities. Left ventricular  diastolic parameters were  normal. The average left ventricular  global longitudinal strain is -21.8 %. The global longitudinal strain is  normal.   2. Right ventricular systolic function is normal. The right ventricular  size is normal. There is normal pulmonary artery systolic pressure.   3. The mitral valve is normal in structure. Mild mitral valve  regurgitation. No evidence of mitral stenosis.   4. The aortic valve is tricuspid. Aortic valve regurgitation is not  visualized. No aortic stenosis is present.   5. The inferior vena cava is normal in size with greater than 50%  respiratory variability, suggesting right atrial pressure of 3 mmHg.    ASSESSMENT AND PLAN:  1.  Paroxysmal atrial fibrillation/flutter: Status post ablation in 2021.  He has had no further episodes of atrial fibrillation.  Not anticoagulated currently.  CHA2DS2-VASc of 1.  He is able to exercise without issue.  No changes.    Current medicines are reviewed at length with the patient today.   The patient does not have concerns regarding his medicines.  The following changes were made today:  none  Labs/ tests ordered today include:  Orders Placed This Encounter  Procedures   EKG 12-Lead     Disposition:   FU 1 year  Signed, Taline Nass Meredith Leeds, MD  01/08/2022 2:00 PM     [facility] [place]  [phone] (office) [phone] (fax)

## 2022-02-25 ENCOUNTER — Encounter (HOSPITAL_COMMUNITY): Payer: Self-pay | Admitting: *Deleted

## 2022-05-14 ENCOUNTER — Ambulatory Visit: Payer: PPO | Admitting: Orthopaedic Surgery

## 2022-05-14 ENCOUNTER — Other Ambulatory Visit (INDEPENDENT_AMBULATORY_CARE_PROVIDER_SITE_OTHER): Payer: PPO

## 2022-05-14 DIAGNOSIS — M25561 Pain in right knee: Secondary | ICD-10-CM | POA: Diagnosis not present

## 2022-05-14 DIAGNOSIS — G8929 Other chronic pain: Secondary | ICD-10-CM

## 2022-05-14 MED ORDER — LIDOCAINE HCL 1 % IJ SOLN
2.0000 mL | INTRAMUSCULAR | Status: AC | PRN
Start: 2022-05-14 — End: 2022-05-14
  Administered 2022-05-14: 2 mL

## 2022-05-14 MED ORDER — METHYLPREDNISOLONE ACETATE 40 MG/ML IJ SUSP
40.0000 mg | INTRAMUSCULAR | Status: AC | PRN
  Administered 2022-05-14: 40 mg via INTRA_ARTICULAR

## 2022-05-14 MED ORDER — BUPIVACAINE HCL 0.5 % IJ SOLN
2.0000 mL | INTRAMUSCULAR | Status: AC | PRN
  Administered 2022-05-14: 2 mL via INTRA_ARTICULAR

## 2022-05-14 NOTE — Progress Notes (Signed)
Office Visit Note   Patient: Michael Hopkins           Date of Birth: 1950/08/29           MRN: [id number] Visit Date: 05/14/2022              Requested by: Creola Corn, MD [address] [place],  [place] [address] PCP: Creola Corn, MD   Assessment & Plan: Visit Diagnoses:  1. Chronic pain of right knee     Plan: Impression is 72 year old gentleman with advanced right knee DJD.  Disease process explained and treatment options were reviewed.  He would like to try a steroid injection.  Handout on Visco was provided.  Follow-up as needed.  Follow-Up Instructions: No follow-ups on file.   Orders:  Orders Placed This Encounter  Procedures   XR KNEE 3 VIEW RIGHT   No orders of the defined types were placed in this encounter.     Procedures: Large Joint Inj: R knee on 05/14/2022 8:49 AM Indications: pain Details: 22 G needle  Arthrogram: No  Medications: 40 mg methylPREDNISolone acetate 40 MG/ML; 2 mL lidocaine 1 %; 2 mL bupivacaine 0.5 % Consent was given by the patient. Patient was prepped and draped in the usual sterile fashion.     Clinical Data: No additional findings.   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  Has never had any injuries or trauma to the knee.  Never had any knee surgeries.  He takes Aleve every morning and has use Voltaren for the symptoms.  He reports tearing his cath about 5 years ago.  He endorses pain behind the knee and around the patella.  Review of Systems  Constitutional: Negative.   HENT: Negative.    Eyes: Negative.   Respiratory: Negative.    Cardiovascular: Negative.   Gastrointestinal: Negative.   Endocrine: Negative.   Genitourinary: Negative.   Skin: Negative.   Allergic/Immunologic: Negative.   Neurological: Negative.   Hematological: Negative.    Psychiatric/Behavioral: Negative.    All other systems reviewed and are negative.    Objective: Vital Signs: There were no vitals taken for this visit.  Physical Exam Vitals and nursing note reviewed.  Constitutional:      Appearance: He is well-developed.  HENT:     Head: Normocephalic and atraumatic.  Eyes:     Pupils: Pupils are equal, round, and reactive to light.  Pulmonary:     Effort: Pulmonary effort is normal.  Abdominal:     Palpations: Abdomen is soft.  Musculoskeletal:        General: Normal range of motion.     Cervical back: Neck supple.  Skin:    General: Skin is warm.  Neurological:     Mental Status: He is alert and oriented to person, place, and time.  Psychiatric:        Behavior: Behavior normal.        Thought Content: Thought content normal.        Judgment: Judgment normal.    Ortho Exam  Examination right knee shows trace effusion.  Slight flexion contracture.  Collaterals and cruciates are stable.  Slight joint line tenderness.  Specialty Comments:  No specialty comments available.  Imaging: No results found.   PMFS History: Patient Active Problem List   Diagnosis  Date Noted   Paroxysmal atrial fibrillation (HCC)    Atrial flutter (HCC)    Past Medical History:  Diagnosis Date   Allergy    Atrial flutter (HCC)    Hyperlipidemia    Paroxysmal atrial fibrillation (HCC)     Family History  Problem Relation Age of Onset   Colon cancer Neg Hx    Esophageal cancer Neg Hx    Rectal cancer Neg Hx    Stomach cancer Neg Hx     Past Surgical History:  Procedure Laterality Date   ATRIAL FIBRILLATION ABLATION N/A 01/08/2020   Procedure: ATRIAL FIBRILLATION ABLATION;  Surgeon: Hillis Range, MD;  [facility];  Service: Cardiovascular;  Laterality: N/A;   HAND SURGERY Right    Broken bone in right hand.   MOUTH SURGERY     Age 69   NASAL SINUS SURGERY     SKULL FRACTURE ELEVATION     Skull Fracture at age 43     Social History   Occupational History   Not on file  Tobacco Use   Smoking status: Former   Smokeless tobacco: Never   Tobacco comments:    Quit 10 years ago  Substance and Sexual Activity   Alcohol use: Yes    Alcohol/week: 3.0 standard drinks of alcohol    Types: 2 Cans of beer, 1 Standard drinks or equivalent per week   Drug use: Never   Sexual activity: Not on file

## 2022-05-20 ENCOUNTER — Other Ambulatory Visit: Payer: Self-pay | Admitting: Cardiology

## 2022-08-17 ENCOUNTER — Telehealth: Payer: Self-pay

## 2022-08-17 ENCOUNTER — Encounter: Payer: Self-pay | Admitting: Physician Assistant

## 2022-08-17 ENCOUNTER — Ambulatory Visit: Payer: PPO | Admitting: Physician Assistant

## 2022-08-17 DIAGNOSIS — M1711 Unilateral primary osteoarthritis, right knee: Secondary | ICD-10-CM | POA: Diagnosis not present

## 2022-08-17 MED ORDER — BUPIVACAINE HCL 0.25 % IJ SOLN
2.0000 mL | INTRAMUSCULAR | Status: AC | PRN
Start: 2022-08-17 — End: 2022-08-17
  Administered 2022-08-17: 2 mL via INTRA_ARTICULAR

## 2022-08-17 MED ORDER — METHYLPREDNISOLONE ACETATE 40 MG/ML IJ SUSP
40.0000 mg | INTRAMUSCULAR | Status: AC | PRN
  Administered 2022-08-17: 40 mg via INTRA_ARTICULAR

## 2022-08-17 MED ORDER — LIDOCAINE HCL 1 % IJ SOLN
2.0000 mL | INTRAMUSCULAR | Status: AC | PRN
  Administered 2022-08-17: 2 mL

## 2022-08-17 NOTE — Telephone Encounter (Signed)
Right knee gel injection  

## 2022-08-17 NOTE — Progress Notes (Signed)
Office Visit Note   Patient: Michael Hopkins           Date of Birth: 09/28/1950           MRN: [id number] Visit Date: 08/17/2022              Requested by: Creola Corn, MD [address] [place],  [place] [address] PCP: Creola Corn, MD  Chief Complaint  Patient presents with  . Right Knee - Follow-up, Pain      HPI: Patient is a pleasant 72 year old gentleman who is a patient of Dr. Roda Shutters..  He has a history of osteoarthritis of his right knee.  He had a steroid injection back in April and seem to help him quite a bit.  It is since worn off.  He is also interested in viscosupplementation  Assessment & Plan: Visit Diagnoses:  1. Unilateral primary osteoarthritis, right knee     Plan: Will go forward with injection today will place authorization for viscosupplementation This patient is diagnosed with osteoarthritis of the knee(s).    Radiographs show evidence of joint space narrowing, osteophytes, subchondral sclerosis and/or subchondral cysts.  This patient has knee pain which interferes with functional and activities of daily living.    This patient has experienced inadequate response, adverse effects and/or intolerance with conservative treatments such as acetaminophen, NSAIDS, topical creams, physical therapy or regular exercise, knee bracing and/or weight loss.   This patient has experienced inadequate response or has a contraindication to intra articular steroid injections for at least 3 months.   This patient is not scheduled to have a total knee replacement within 6 months of starting treatment with viscosupplementation.  Follow-Up Instructions: No follow-ups on file.   Ortho Exam  Patient is alert, oriented, no adenopathy, well-dressed, normal affect, normal respiratory effort. Right knee no effusion no erythema he does have a palpable Baker's cyst posteriorly but no redness no erythema compartments are soft and compressible he is neurovascular intact  Imaging: No  results found. No images are attached to the encounter.  Labs: No results found for: "HGBA1C", "ESRSEDRATE", "CRP", "LABURIC", "REPTSTATUS", "GRAMSTAIN", "CULT", "LABORGA"   No results found for: "ALBUMIN", "PREALBUMIN", "CBC"  No results found for: "MG" No results found for: "VD25OH"  No results found for: "PREALBUMIN"    Latest Ref Rng & Units 12/19/2019   11:42 AM  CBC EXTENDED  WBC 3.4 - 10.8 x10E3/uL 6.1   RBC 4.14 - 5.80 x10E6/uL 4.28   Hemoglobin 13.0 - 17.7 g/dL 02.7   HCT 25.3 - 66.4 % 40.0   Platelets 150 - 450 x10E3/uL 211   NEUT# 1.4 - 7.0 x10E3/uL 3.6   Lymph# 0.7 - 3.1 x10E3/uL 1.6      There is no height or weight on file to calculate BMI.  Orders:  No orders of the defined types were placed in this encounter.  No orders of the defined types were placed in this encounter.    Procedures: Large Joint Inj on 08/17/2022 9:39 AM Indications: pain and diagnostic evaluation Details: 25 G 1.5 in needle, anteromedial approach  Arthrogram: No  Medications: 40 mg methylPREDNISolone acetate 40 MG/ML; 2 mL lidocaine 1 %; 2 mL bupivacaine 0.25 % Outcome: tolerated well, no immediate complications Procedure, treatment alternatives, risks and benefits explained, specific risks discussed. Consent was given by the patient.    Clinical Data: No additional findings.  ROS:  All other systems negative, except as noted in the HPI. Review of Systems  Objective: Vital Signs: There were  no vitals taken for this visit.  Specialty Comments:  No specialty comments available.  PMFS History: Patient Active Problem List   Diagnosis Date Noted  . Unilateral primary osteoarthritis, right knee 08/17/2022  . Paroxysmal atrial fibrillation (HCC)   . Atrial flutter [facility])    Past Medical History:  Diagnosis Date  . Allergy   . Atrial flutter (HCC)   . Hyperlipidemia   . Paroxysmal atrial fibrillation (HCC)     Family History  Problem Relation Age of Onset  . Colon  cancer Neg Hx   . Esophageal cancer Neg Hx   . Rectal cancer Neg Hx   . Stomach cancer Neg Hx     Past Surgical History:  Procedure Laterality Date  . ATRIAL FIBRILLATION ABLATION N/A 01/08/2020   Procedure: ATRIAL FIBRILLATION ABLATION;  Surgeon: Hillis Range, MD;  [facility];  Service: Cardiovascular;  Laterality: N/A;  . HAND SURGERY Right    Broken bone in right hand.  Marland Kitchen MOUTH SURGERY     Age 80  . NASAL SINUS SURGERY    . SKULL FRACTURE ELEVATION     Skull Fracture at age 89    Social History   Occupational History  . Not on file  Tobacco Use  . Smoking status: Former  . Smokeless tobacco: Never  . Tobacco comments:    Quit 10 years ago  Substance and Sexual Activity  . Alcohol use: Yes    Alcohol/week: 3.0 standard drinks of alcohol    Types: 2 Cans of beer, 1 Standard drinks or equivalent per week  . Drug use: Never  . Sexual activity: Not on file

## 2022-08-18 NOTE — Telephone Encounter (Signed)
VOB submitted for Monovisc, right knee  

## 2022-09-14 ENCOUNTER — Ambulatory Visit: Payer: PPO | Admitting: Orthopaedic Surgery

## 2022-09-14 ENCOUNTER — Encounter: Payer: Self-pay | Admitting: Orthopaedic Surgery

## 2022-09-14 ENCOUNTER — Other Ambulatory Visit (INDEPENDENT_AMBULATORY_CARE_PROVIDER_SITE_OTHER): Payer: PPO

## 2022-09-14 DIAGNOSIS — M79672 Pain in left foot: Secondary | ICD-10-CM | POA: Diagnosis not present

## 2022-09-14 MED ORDER — LIDOCAINE HCL 1 % IJ SOLN
1.0000 mL | INTRAMUSCULAR | Status: AC | PRN
  Administered 2022-09-14: 1 mL

## 2022-09-14 MED ORDER — METHYLPREDNISOLONE ACETATE 40 MG/ML IJ SUSP
40.0000 mg | INTRAMUSCULAR | Status: AC | PRN
  Administered 2022-09-14: 40 mg

## 2022-09-14 MED ORDER — BUPIVACAINE HCL 0.5 % IJ SOLN
1.0000 mL | INTRAMUSCULAR | Status: AC | PRN
Start: 2022-09-14 — End: 2022-09-14
  Administered 2022-09-14: 1 mL

## 2022-09-14 NOTE — Progress Notes (Signed)
Office Visit Note   Patient: Michael Hopkins           Date of Birth: 08/07/1950           MRN: [id number] Visit Date: 09/14/2022              Requested by: Creola Corn, MD [address] [place],  [place] [address] PCP: Creola Corn, MD   Assessment & Plan: Visit Diagnoses:  1. Pain of left heel     Plan: Michael Hopkins is a 72 year old gentleman with left plantar fasciitis.  Treatment options were explained and he elected to undergo a cortisone injection.  He will modify his activities and refrain from pickleball for 6 weeks.  Home exercise program provided.  He tolerated the injection well.  Follow-Up Instructions: No follow-ups on file.   Orders:  Orders Placed This Encounter  Procedures   XR Os Calcis Left   No orders of the defined types were placed in this encounter.     Procedures: Foot Inj: left plantar fascia  Date/Time: 09/14/2022 4:33 PM  Performed by: Tarry Kos, MD Authorized by: Tarry Kos, MD   Consent Given by:  Patient Timeout: prior to procedure the correct patient, procedure, and site was verified   Indications:  Pain Condition: Plantar Fasciitis   Location: left plantar fascia muscle   Prep: patient was prepped and draped in usual sterile fashion   Needle Size:  25 G Medications:  1 mL lidocaine 1 %; 1 mL bupivacaine 0.5 %; 40 mg methylPREDNISolone acetate 40 MG/ML    Clinical Data: No additional findings.   Subjective: Chief Complaint  Patient presents with   Left Heel - Pain    HPI Michael Hopkins is a 72 year old gentleman here for 2 weeks left heel pain centrally.  Denies any injuries.  Walking makes it worse and the pain is worse when he first gets up to walk.  Bought new inserts recently which has not helped.  Hurts worse when he plays pickle ball. Review of Systems  Constitutional: Negative.   HENT: Negative.    Eyes: Negative.   Respiratory: Negative.    Cardiovascular: Negative.   Gastrointestinal: Negative.   Endocrine: Negative.    Genitourinary: Negative.   Skin: Negative.   Allergic/Immunologic: Negative.   Neurological: Negative.   Hematological: Negative.   Psychiatric/Behavioral: Negative.    All other systems reviewed and are negative.    Objective: Vital Signs: There were no vitals taken for this visit.  Physical Exam Vitals and nursing note reviewed.  Constitutional:      Appearance: He is well-developed.  Pulmonary:     Effort: Pulmonary effort is normal.  Abdominal:     Palpations: Abdomen is soft.  Skin:    General: Skin is warm.  Neurological:     Mental Status: He is alert and oriented to person, place, and time.  Psychiatric:        Behavior: Behavior normal.        Thought Content: Thought content normal.        Judgment: Judgment normal.     Ortho Exam Examination of the left heel shows central tenderness to palpation at the insertion of the plantar fascia. Specialty Comments:  No specialty comments available.  Imaging: No results found.   PMFS History: Patient Active Problem List   Diagnosis Date Noted   Unilateral primary osteoarthritis, right knee 08/17/2022   Paroxysmal atrial fibrillation (HCC)    Atrial flutter (HCC)    Past Medical History:  Diagnosis Date   Allergy    Atrial flutter (HCC)    Hyperlipidemia    Paroxysmal atrial fibrillation (HCC)     Family History  Problem Relation Age of Onset   Colon cancer Neg Hx    Esophageal cancer Neg Hx    Rectal cancer Neg Hx    Stomach cancer Neg Hx     Past Surgical History:  Procedure Laterality Date   ATRIAL FIBRILLATION ABLATION N/A 01/08/2020   Procedure: ATRIAL FIBRILLATION ABLATION;  Surgeon: Hillis Range, MD;  [facility];  Service: Cardiovascular;  Laterality: N/A;   HAND SURGERY Right    Broken bone in right hand.   MOUTH SURGERY     Age 58   NASAL SINUS SURGERY     SKULL FRACTURE ELEVATION     Skull Fracture at age 37    Social History   Occupational History   Not on file   Tobacco Use   Smoking status: Former   Smokeless tobacco: Never   Tobacco comments:    Quit 10 years ago  Substance and Sexual Activity   Alcohol use: Yes    Alcohol/week: 3.0 standard drinks of alcohol    Types: 2 Cans of beer, 1 Standard drinks or equivalent per week   Drug use: Never   Sexual activity: Not on file

## 2022-10-14 ENCOUNTER — Telehealth: Payer: Self-pay | Admitting: Cardiology

## 2022-10-14 ENCOUNTER — Telehealth: Payer: Self-pay | Admitting: *Deleted

## 2022-10-14 NOTE — Telephone Encounter (Signed)
Patient called wanting to move up his televisit.

## 2022-10-14 NOTE — Telephone Encounter (Signed)
Pre-operative Risk Assessment    Patient Name: Michael Hopkins  DOB: 1950/02/23 MRN: [id number]    DATE OF LAST VISIT: 01/08/22 DR. CAMNITZ DATE OF NEXT VISIT: NONE  Request for Surgical Clearance    Procedure:   RIGHT TKR  Date of Surgery:  Clearance TBD                                 Surgeon:  DR. Georgena Spurling Surgeon's Group or Practice Name:  [facility] Phone number:  [phone] ATTN: Bridgett Larsson Fax number:  [phone] AND [phone]   Type of Clearance Requested:   - Medical ; ASA   Type of Anesthesia:  Spinal   Additional requests/questions:    Elpidio Anis   10/14/2022, 10:35 AM

## 2022-10-14 NOTE — Telephone Encounter (Signed)
Pt has been scheduled for tele pre op appt 10/27/22 @ 10:20. Med rec and consent are done.      Patient Consent for Virtual Visit        Michael Hopkins has provided verbal consent on 10/14/2022 for a virtual visit (video or telephone).   CONSENT FOR VIRTUAL VISIT FOR:  Michael Hopkins  By participating in this virtual visit I agree to the following:  I hereby voluntarily request, consent and authorize [facility] and its employed or contracted physicians, physician assistants, nurse practitioners or other licensed health care professionals (the Practitioner), to provide me with telemedicine health care services (the "Services") as deemed necessary by the treating Practitioner. I acknowledge and consent to receive the Services by the Practitioner via telemedicine. I understand that the telemedicine visit will involve communicating with the Practitioner through live audiovisual communication technology and the disclosure of certain medical information by electronic transmission. I acknowledge that I have been given the opportunity to request an in-person assessment or other available alternative prior to the telemedicine visit and am voluntarily participating in the telemedicine visit.  I understand that I have the right to withhold or withdraw my consent to the use of telemedicine in the course of my care at any time, without affecting my right to future care or treatment, and that the Practitioner or I may terminate the telemedicine visit at any time. I understand that I have the right to inspect all information obtained and/or recorded in the course of the telemedicine visit and may receive copies of available information for a reasonable fee.  I understand that some of the potential risks of receiving the Services via telemedicine include:  Delay or interruption in medical evaluation due to technological equipment failure or disruption; Information transmitted may not be sufficient (e.g.  poor resolution of images) to allow for appropriate medical decision making by the Practitioner; and/or  In rare instances, security protocols could fail, causing a breach of personal health information.  Furthermore, I acknowledge that it is my responsibility to provide information about my medical history, conditions and care that is complete and accurate to the best of my ability. I acknowledge that Practitioner's advice, recommendations, and/or decision may be based on factors not within their control, such as incomplete or inaccurate data provided by me or distortions of diagnostic images or specimens that may result from electronic transmissions. I understand that the practice of medicine is not an exact science and that Practitioner makes no warranties or guarantees regarding treatment outcomes. I acknowledge that a copy of this consent can be made available to me via my patient portal [facility] MyChart), or I can request a printed copy by calling the office of [facility].    I understand that my insurance will be billed for this visit.   I have read or had this consent read to me. I understand the contents of this consent, which adequately explains the benefits and risks of the Services being provided via telemedicine.  I have been provided ample opportunity to ask questions regarding this consent and the Services and have had my questions answered to my satisfaction. I give my informed consent for the services to be provided through the use of telemedicine in my medical care

## 2022-10-14 NOTE — Telephone Encounter (Signed)
Spoke with patient aware no changes are able to happen

## 2022-10-14 NOTE — Telephone Encounter (Signed)
Patient has dropped off a pre-op clearance form to be filled out and approved.  Thank you,

## 2022-10-14 NOTE — Telephone Encounter (Signed)
Name: Michael Hopkins  DOB: 1950-10-18  MRN: [id number]  Primary Cardiologist: None   Preoperative team, please contact this patient and set up a phone call appointment for further preoperative risk assessment. Please obtain consent and complete medication review. Thank you for your help.  I confirm that guidance regarding antiplatelet and oral anticoagulation therapy has been completed and, if necessary, noted below.  I also confirmed the patient resides in the state of [place]. As per [facility] Medical Board telemedicine laws, the patient must reside in the state in which the provider is licensed.   Patient's aspirin is not prescribed by cardiology.  Recommendations for holding aspirin will need to come from prescribing provider.  Ronney Asters, NP 10/14/2022, 10:55 AM [facility]

## 2022-10-14 NOTE — Telephone Encounter (Signed)
Pt has been scheduled for tele pre op appt 10/27/22 @ 10:20. Med rec and consent are done.

## 2022-10-27 ENCOUNTER — Ambulatory Visit: Payer: PPO | Attending: Cardiovascular Disease | Admitting: Nurse Practitioner

## 2022-10-27 DIAGNOSIS — Z01810 Encounter for preprocedural cardiovascular examination: Secondary | ICD-10-CM

## 2022-10-27 NOTE — Progress Notes (Signed)
Virtual Visit via Telephone Note   Because of Michael Hopkins's co-morbid illnesses, he is at least at moderate risk for complications without adequate follow up.  This format is felt to be most appropriate for this patient at this time.  The patient did not have access to video technology/had technical difficulties with video requiring transitioning to audio format only (telephone).  All issues noted in this document were discussed and addressed.  No physical exam could be performed with this format.  Please refer to the patient's chart for his consent to telehealth for [facility].  Evaluation Performed:  Preoperative cardiovascular risk assessment _____________   Date:  10/27/2022   Patient ID:  Michael Hopkins, DOB 07-Jun-1950, MRN [id number] Patient Location:  Home Provider location:   Office  Primary Care Provider:  Creola Corn, MD Primary Cardiologist:  None  Chief Complaint / Patient Profile   72 y.o. y/o male with a h/o paroxysmal atrial fibrillation/flutter s/p ablation in 2021 (not on anticoagulation, CHA2DS2VASc = 1) and hyperlipidemia who is pending R TKR with Dr. Georgena Spurling of [facility] and presents today for telephonic preoperative cardiovascular risk assessment.  History of Present Illness    Michael Hopkins is a 72 y.o. male who presents via audio/video conferencing for a telehealth visit today.  Pt was last seen in cardiology clinic on 01/08/2022 by Dr. Elberta Fortis. At that time Tonnie Kahle was doing well.  The patient is now pending procedure as outlined above. Since his last visit, he has been stable from a cardiac standpoint.  He is very active and plays pickle ball multiple days a week.  He denies chest pain, palpitations, dyspnea, pnd, orthopnea, n, v, dizziness, syncope, edema, weight gain, or early satiety. All other systems reviewed and are otherwise negative except as noted above.   Past Medical History     Past Medical History:  Diagnosis Date   Allergy    Atrial flutter (HCC)    Hyperlipidemia    Paroxysmal atrial fibrillation (HCC)    Past Surgical History:  Procedure Laterality Date   ATRIAL FIBRILLATION ABLATION N/A 01/08/2020   Procedure: ATRIAL FIBRILLATION ABLATION;  Surgeon: Hillis Range, MD;  [facility];  Service: Cardiovascular;  Laterality: N/A;   HAND SURGERY Right    Broken bone in right hand.   MOUTH SURGERY     Age 3   NASAL SINUS SURGERY     SKULL FRACTURE ELEVATION     Skull Fracture at age 80     Allergies  No Known Allergies  Home Medications    Prior to Admission medications   Medication Sig Start Date End Date Taking? Authorizing Provider  aspirin EC 81 MG tablet Take by mouth daily. Take 1/2 tab daily 09/09/09   [provider]  atorvastatin (LIPITOR) 20 MG tablet TAKE 1 TABLET BY MOUTH EVERY DAY 05/20/22   Camnitz, Andree Coss, MD  Multiple Vitamin (MULTIVITAMIN) tablet Take 1 tablet by mouth daily.    [provider]  Naproxen Sodium (ALEVE) 220 MG CAPS Take by mouth daily at 6 (six) AM.    [provider]  sildenafil (REVATIO) 20 MG tablet Take 20 mg by mouth 2 (two) times daily as needed.    [provider]    Physical Exam    Vital Signs:  Raymir Bendik does not have vital signs available for review today.  Given telephonic nature of communication, physical exam is limited. AAOx3. NAD. Normal  affect.  Speech and respirations are unlabored.  Accessory Clinical Findings    None  Assessment & Plan    1.  Preoperative Cardiovascular Risk Assessment:  According to the Revised Cardiac Risk Index (RCRI), his Perioperative Risk of Major Cardiac Event is (%): 0.4. His Functional Capacity in METs is: 9.89 according to the Duke Activity Status Index (DASI). Therefore, based on ACC/AHA guidelines, patient would be at acceptable risk for the planned procedure without further cardiovascular  testing.  The patient was advised that if he develops new symptoms prior to surgery to contact our office to arrange for a follow-up visit, and he verbalized understanding.  He may hold Aspirin for 5-7 days prior to surgery. Please resume Aspirin as soon as possible postprocedure, at the discretion of the surgeon.     A copy of this note will be routed to requesting surgeon.  Time:   Today, I have spent 5 minutes with the patient with telehealth technology discussing medical history, symptoms, and management plan.     Joylene Grapes, NP  10/27/2022, 10:32 AM

## 2023-01-07 IMAGING — MR MR FOOT*L* W/O CM
4 of 5 series · 17 of 40 positions shown · non-contrast
Comparison: None.

CLINICAL DATA: Medial left foot pain.  No known injury.

EXAM:
MRI OF THE LEFT FOOT WITHOUT CONTRAST
TECHNIQUE: Multiplanar, multisequence MR imaging of the left foot was
performed. No intravenous contrast was administered.

[Series 3: T1 · coronal · 3.0mm · 0.28mm/px · 3 of 48 slices shown (1 of 2)]
[im 9/48]
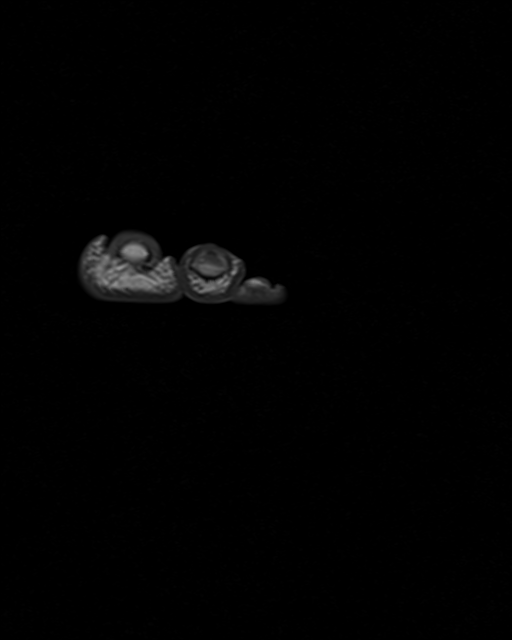
[im 26/48]
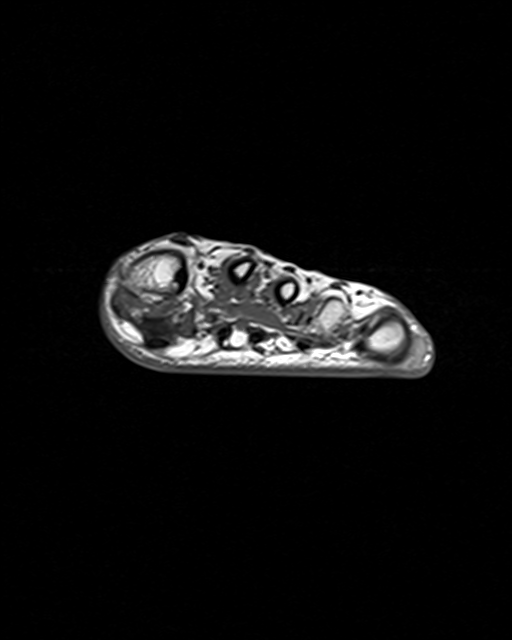
[im 43/48]
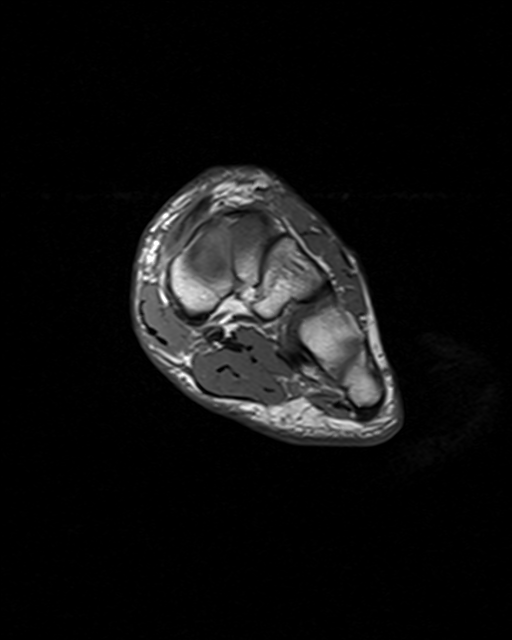

[Series 4: T2 fat-sat · coronal · 3.0mm · 0.28mm/px · 8 of 48 slices shown (1 of 2)]
[im 1/48]
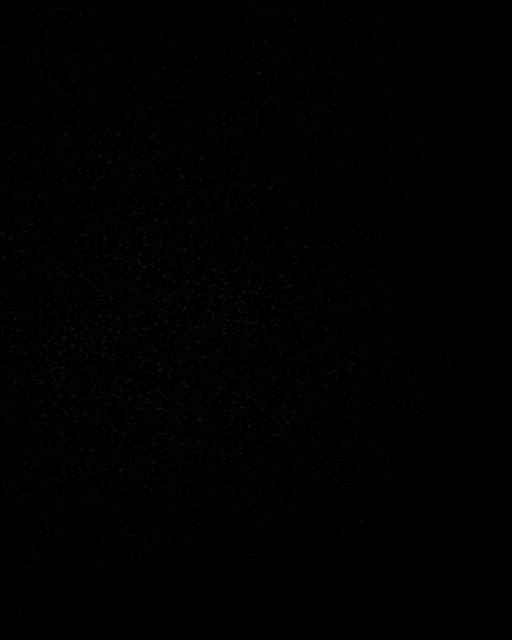
[im 5/48]
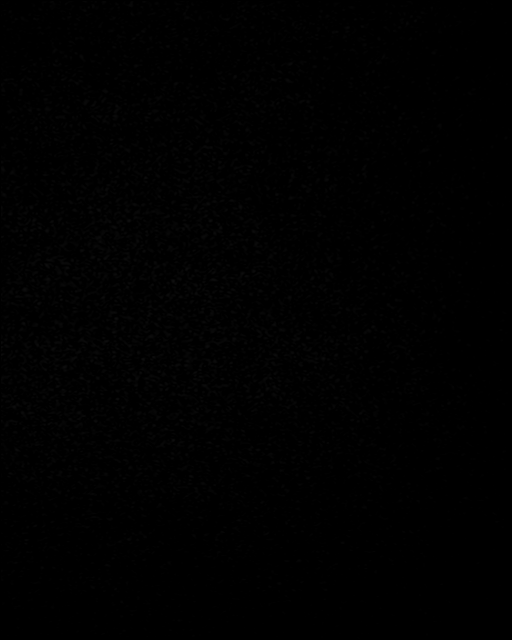
[im 10/48]
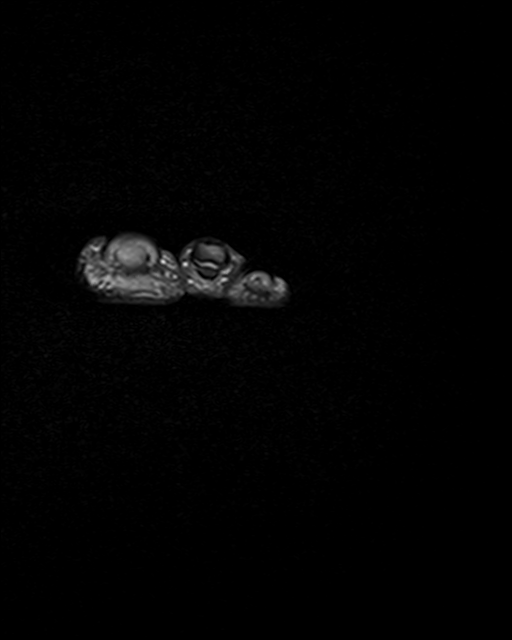
[im 15/48]
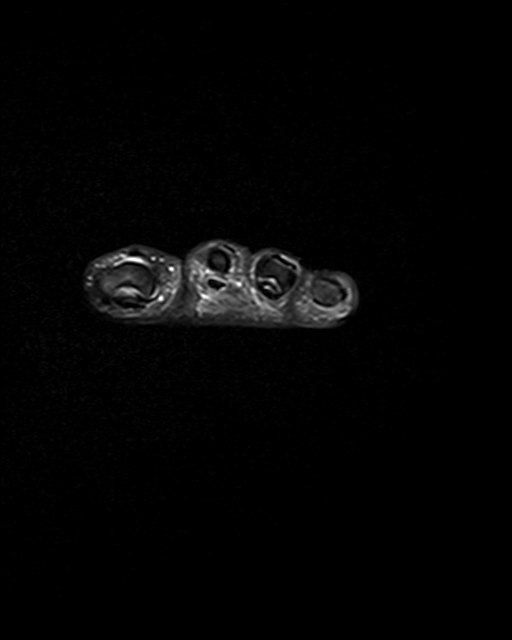
[im 19/48]
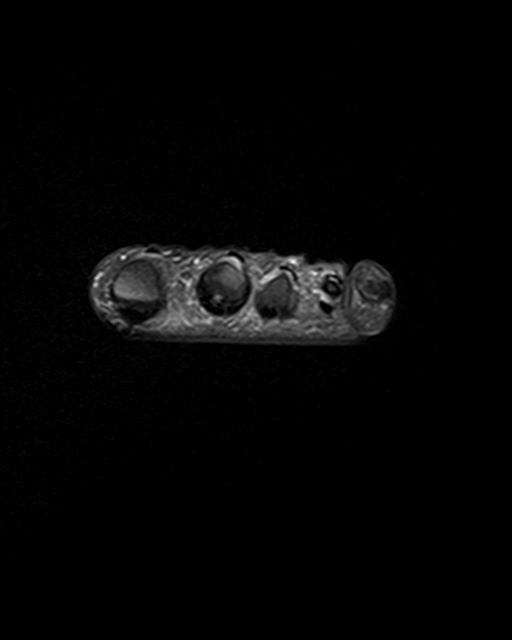
[im 24/48]
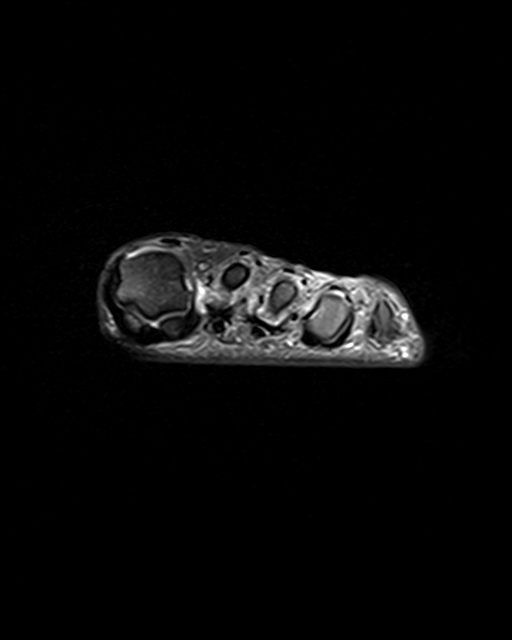
[im 29/48]
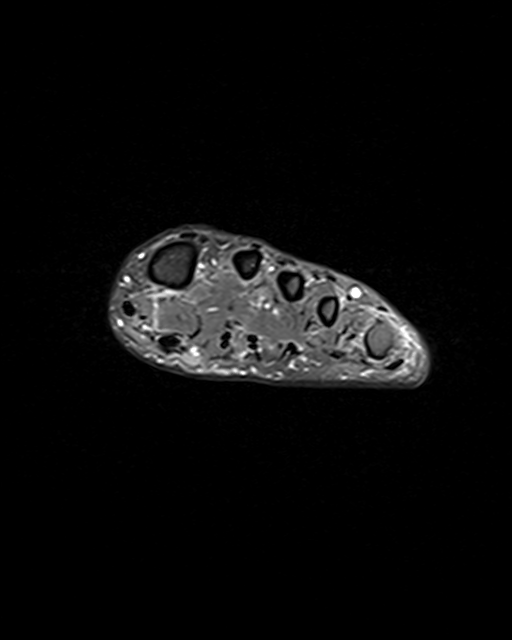
[im 43/48]
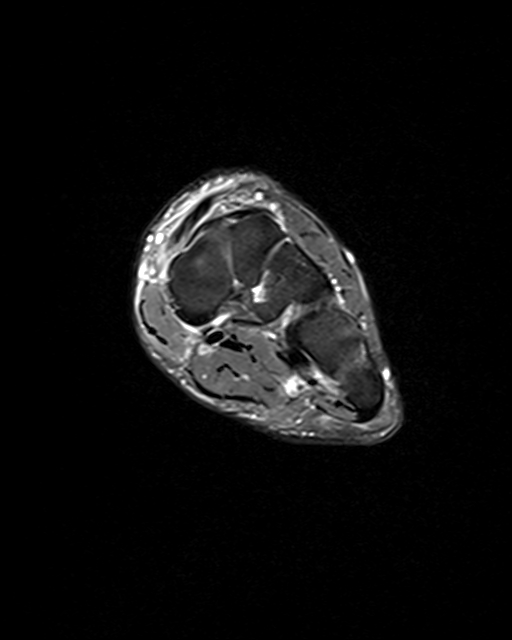

[Series 5: T2 fat-sat · axial · 3.0mm · 0.45mm/px · z∈[-39,+39]mm · 3 of 21 slices shown (2 of 2)]
[im 1/21]
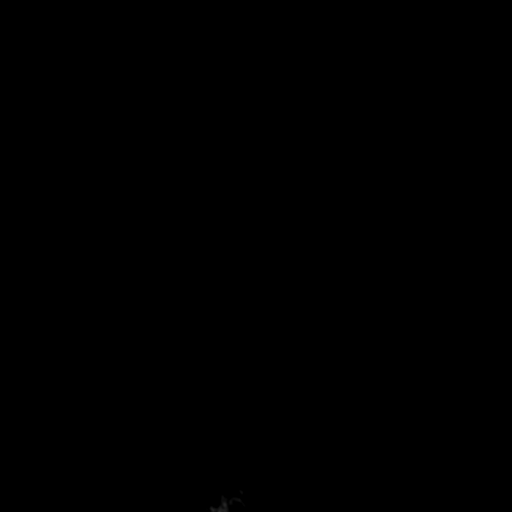
[im 11/21]
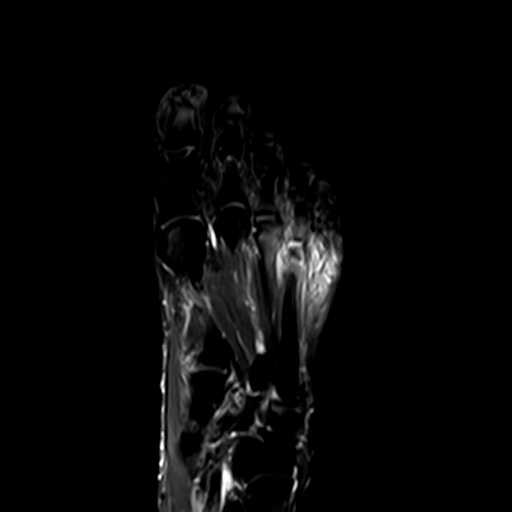
[im 21/21]
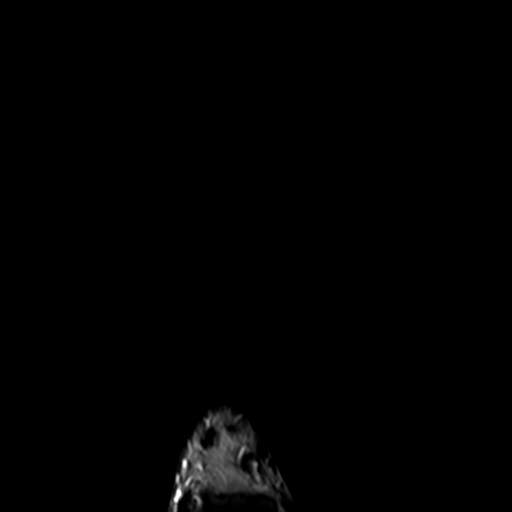

[Series 6: T1 · axial · 3.0mm · 0.45mm/px · z∈[-39,+39]mm · 3 of 21 slices shown (2 of 2)]
[im 1/21]
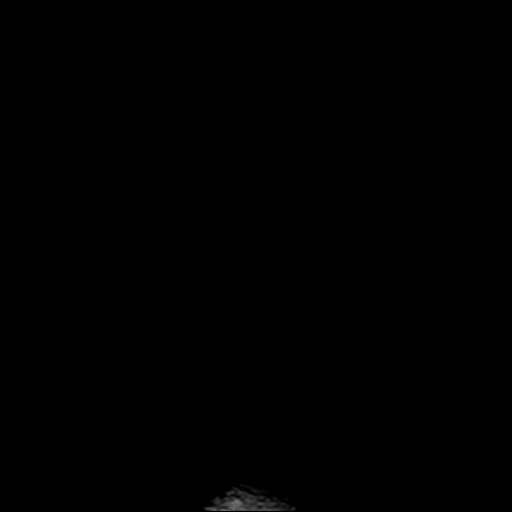
[im 11/21]
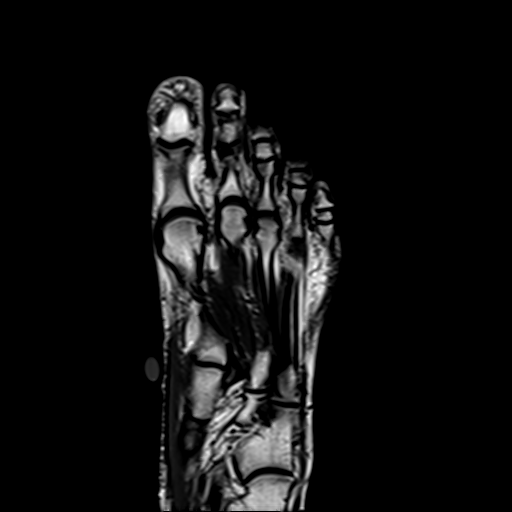
[im 21/21]
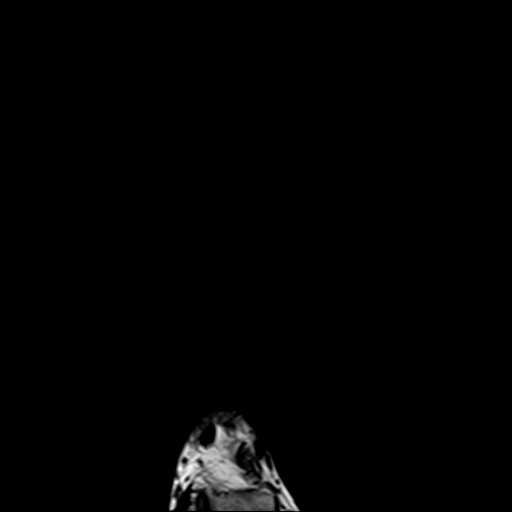

[17 of 40 positions shown; findings below may reference images not displayed]

FINDINGS: Bones/Joint/Cartilage

Marrow signal is normal without fracture, stress change or focal
lesion. There is no evidence of arthropathy and no joint effusion.

Ligaments

Intact and normal in appearance.

Muscles and Tendons

Intact and normal in appearance.

Soft tissues

Markers are placed about the region of concern along the medial
aspect of the foot at approximately the level of the first
tarsometatarsal joint. No underlying fluid collection, mass or other
abnormality is identified.
IMPRESSION: Negative examination.  No finding to explain the patient's symptoms.

## 2023-03-17 ENCOUNTER — Telehealth: Payer: Self-pay | Admitting: Cardiology

## 2023-03-17 NOTE — Telephone Encounter (Signed)
 Spoke with patient and he was calling about elevated BP.  It doesn't look like he has been seen by gen card. Advised to contact PCP about his elevated BP. ED precautions discussed

## 2023-03-17 NOTE — Telephone Encounter (Signed)
  Per MyChart scheduling message:  Pt c/o BP issue: STAT if pt c/o blurred vision, one-sided weakness or slurred speech  1. What are your last 5 BP readings?   2. Are you having any other symptoms (ex. Dizziness, headache, blurred vision, passed out)?   3. What is your BP issue?     1:  157/88       170/97       154/82       138/78       177/92        Not necessarily in that order   2:    No  symptoms noticed   3:     Much higher than normal.

## 2023-05-19 ENCOUNTER — Other Ambulatory Visit: Payer: Self-pay | Admitting: Cardiology

## 2023-06-16 ENCOUNTER — Other Ambulatory Visit: Payer: Self-pay | Admitting: Nurse Practitioner

## 2023-07-27 ENCOUNTER — Other Ambulatory Visit: Payer: Self-pay | Admitting: Nurse Practitioner
